# Patient Record
Sex: Female | Born: 1960 | Race: White | Hispanic: No | Marital: Married | Smoking: Former smoker
Health system: Southern US, Community
[De-identification: ages and names within clinical notes are randomized; demographics above are authoritative.]

## PROBLEM LIST (undated history)

## (undated) DIAGNOSIS — F419 Anxiety disorder, unspecified: Secondary | ICD-10-CM

## (undated) DIAGNOSIS — E119 Type 2 diabetes mellitus without complications: Secondary | ICD-10-CM

## (undated) DIAGNOSIS — Z87442 Personal history of urinary calculi: Secondary | ICD-10-CM

## (undated) DIAGNOSIS — I10 Essential (primary) hypertension: Secondary | ICD-10-CM

## (undated) DIAGNOSIS — R7303 Prediabetes: Secondary | ICD-10-CM

## (undated) DIAGNOSIS — E785 Hyperlipidemia, unspecified: Secondary | ICD-10-CM

## (undated) DIAGNOSIS — T7840XA Allergy, unspecified, initial encounter: Secondary | ICD-10-CM

## (undated) HISTORY — DX: Anxiety disorder, unspecified: F41.9

## (undated) HISTORY — DX: Allergy, unspecified, initial encounter: T78.40XA

## (undated) HISTORY — DX: Personal history of urinary calculi: Z87.442

## (undated) HISTORY — DX: Type 2 diabetes mellitus without complications: E11.9

## (undated) HISTORY — DX: Essential (primary) hypertension: I10

## (undated) HISTORY — DX: Prediabetes: R73.03

## (undated) HISTORY — DX: Hyperlipidemia, unspecified: E78.5

## (undated) HISTORY — PX: TONSILLECTOMY AND ADENOIDECTOMY: SUR1326

---

## 1998-05-27 ENCOUNTER — Encounter: Payer: Self-pay | Admitting: Obstetrics and Gynecology

## 1998-05-27 ENCOUNTER — Ambulatory Visit (HOSPITAL_COMMUNITY): Admission: RE | Admit: 1998-05-27 | Discharge: 1998-05-27 | Payer: Self-pay | Admitting: Surgery

## 1998-06-14 ENCOUNTER — Other Ambulatory Visit: Admission: RE | Admit: 1998-06-14 | Discharge: 1998-06-14 | Payer: Self-pay | Admitting: Obstetrics and Gynecology

## 1999-09-26 ENCOUNTER — Other Ambulatory Visit: Admission: RE | Admit: 1999-09-26 | Discharge: 1999-09-26 | Payer: Self-pay | Admitting: *Deleted

## 2000-10-07 ENCOUNTER — Other Ambulatory Visit: Admission: RE | Admit: 2000-10-07 | Discharge: 2000-10-07 | Payer: Self-pay | Admitting: Obstetrics and Gynecology

## 2001-10-20 ENCOUNTER — Other Ambulatory Visit: Admission: RE | Admit: 2001-10-20 | Discharge: 2001-10-20 | Payer: Self-pay | Admitting: Obstetrics and Gynecology

## 2002-07-15 ENCOUNTER — Ambulatory Visit (HOSPITAL_COMMUNITY): Admission: RE | Admit: 2002-07-15 | Discharge: 2002-07-15 | Payer: Self-pay | Admitting: Obstetrics and Gynecology

## 2002-07-15 ENCOUNTER — Encounter: Payer: Self-pay | Admitting: Obstetrics and Gynecology

## 2003-07-20 ENCOUNTER — Ambulatory Visit (HOSPITAL_COMMUNITY): Admission: RE | Admit: 2003-07-20 | Discharge: 2003-07-20 | Payer: Self-pay | Admitting: Obstetrics and Gynecology

## 2004-08-02 ENCOUNTER — Ambulatory Visit (HOSPITAL_COMMUNITY): Admission: RE | Admit: 2004-08-02 | Discharge: 2004-08-02 | Payer: Self-pay | Admitting: Obstetrics and Gynecology

## 2004-08-16 ENCOUNTER — Encounter: Admission: RE | Admit: 2004-08-16 | Discharge: 2004-08-16 | Payer: Self-pay | Admitting: Obstetrics and Gynecology

## 2005-08-20 ENCOUNTER — Ambulatory Visit (HOSPITAL_COMMUNITY): Admission: RE | Admit: 2005-08-20 | Discharge: 2005-08-20 | Payer: Self-pay | Admitting: Obstetrics and Gynecology

## 2006-09-25 ENCOUNTER — Encounter: Admission: RE | Admit: 2006-09-25 | Discharge: 2006-09-25 | Payer: Self-pay | Admitting: Family Medicine

## 2007-08-11 ENCOUNTER — Ambulatory Visit (HOSPITAL_COMMUNITY): Admission: RE | Admit: 2007-08-11 | Discharge: 2007-08-11 | Payer: Self-pay | Admitting: Internal Medicine

## 2007-08-11 ENCOUNTER — Other Ambulatory Visit: Admission: RE | Admit: 2007-08-11 | Discharge: 2007-08-11 | Payer: Self-pay | Admitting: Internal Medicine

## 2007-09-15 ENCOUNTER — Ambulatory Visit (HOSPITAL_COMMUNITY): Admission: RE | Admit: 2007-09-15 | Discharge: 2007-09-15 | Payer: Self-pay | Admitting: Urology

## 2008-02-23 ENCOUNTER — Ambulatory Visit (HOSPITAL_COMMUNITY): Admission: RE | Admit: 2008-02-23 | Discharge: 2008-02-23 | Payer: Self-pay | Admitting: Internal Medicine

## 2008-10-11 ENCOUNTER — Ambulatory Visit (HOSPITAL_COMMUNITY): Admission: RE | Admit: 2008-10-11 | Discharge: 2008-10-11 | Payer: Self-pay | Admitting: Internal Medicine

## 2008-10-12 ENCOUNTER — Other Ambulatory Visit: Admission: RE | Admit: 2008-10-12 | Discharge: 2008-10-12 | Payer: Self-pay | Admitting: Internal Medicine

## 2009-06-26 IMAGING — CT CT UROGRAM
2 series · 16 of 42 positions shown, 19 images · non-contrast
Comparison: NONE

CLINICAL DATA: Left flank pain. 

CT UROGRAM
TECHNIQUE: Multiple contiguous axial images are performed from 
the lung bases through the pubic symphysis without oral or IV 
contrast.

[Series 2: wo · axial · 0.68mm/px · z∈[+664,+1033]mm · 13 of 137 slices shown, 16 images]
[im 9/137  soft-tissue]
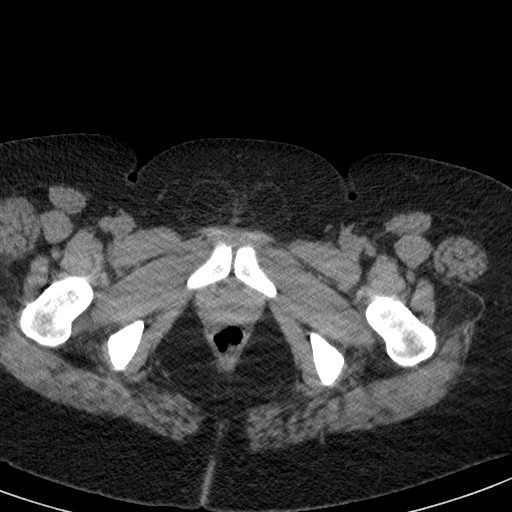
[im 9/137  bone]
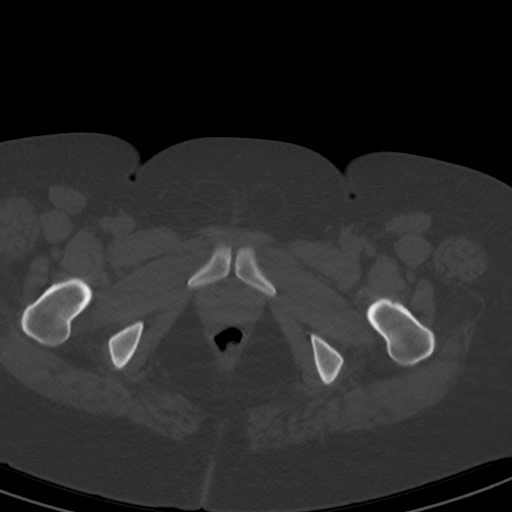
[im 22/137  soft-tissue]
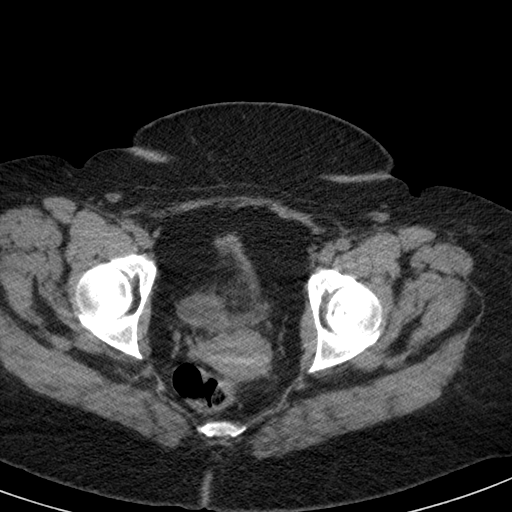
[im 36/137  soft-tissue]
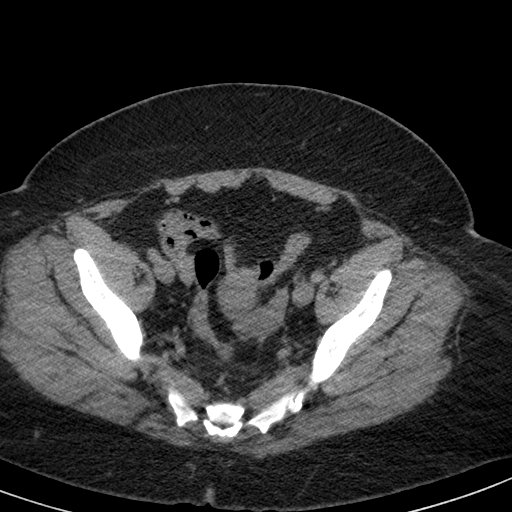
[im 49/137  soft-tissue]
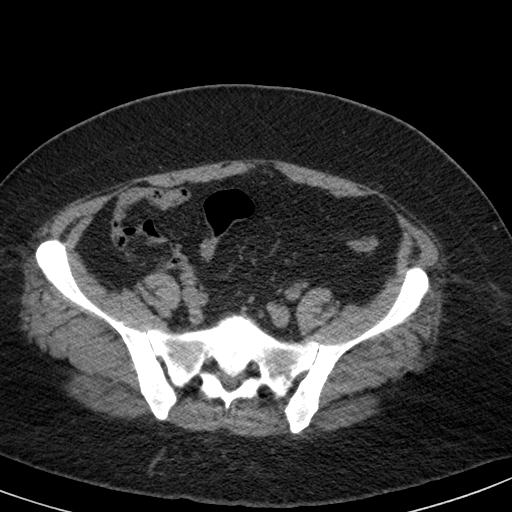
[im 62/137  soft-tissue]
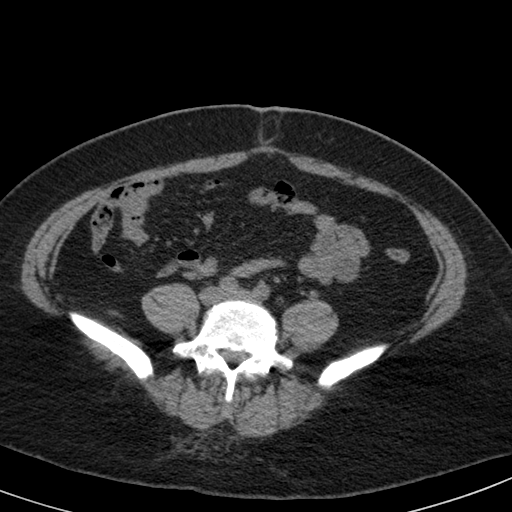
[im 75/137  soft-tissue]
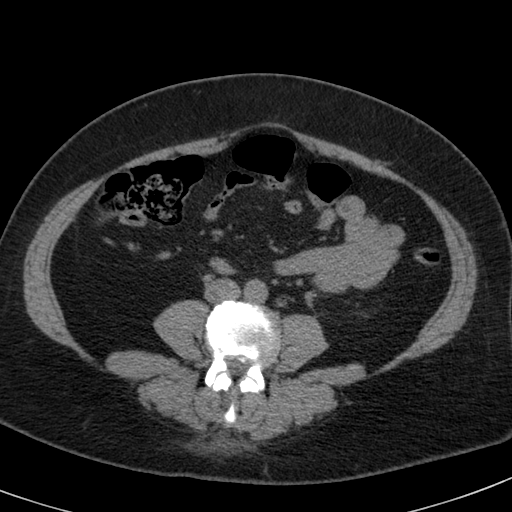
[im 88/137  soft-tissue]
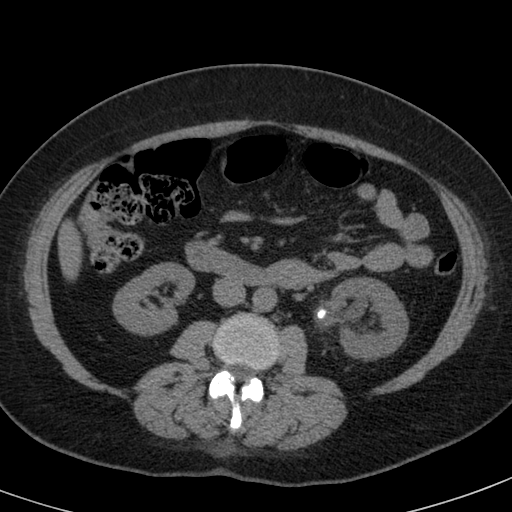
[im 101/137  soft-tissue]
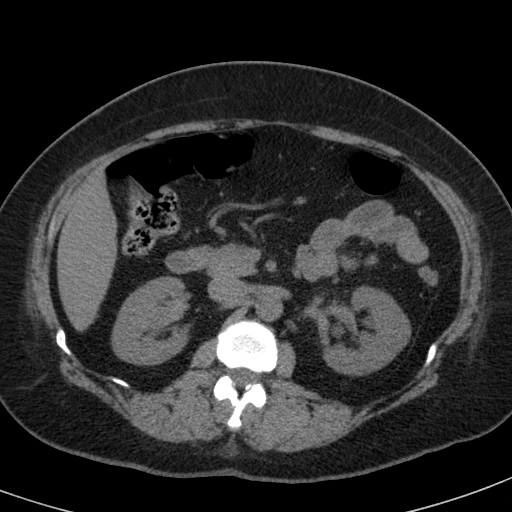
[im 115/137  soft-tissue]
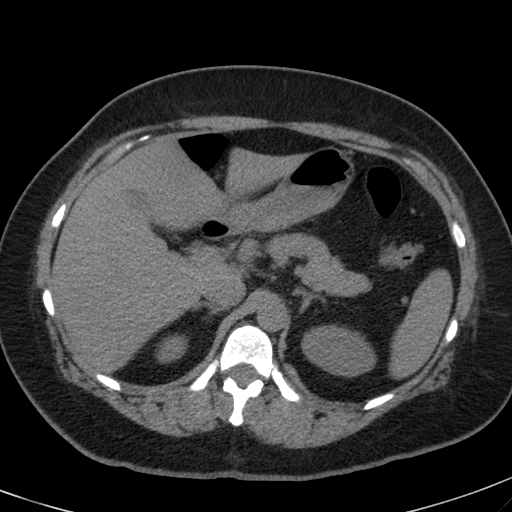
[im 115/137  bone]
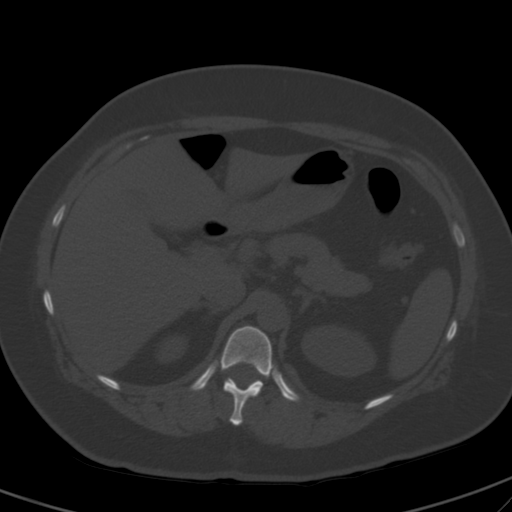
[im 119/137  lung]
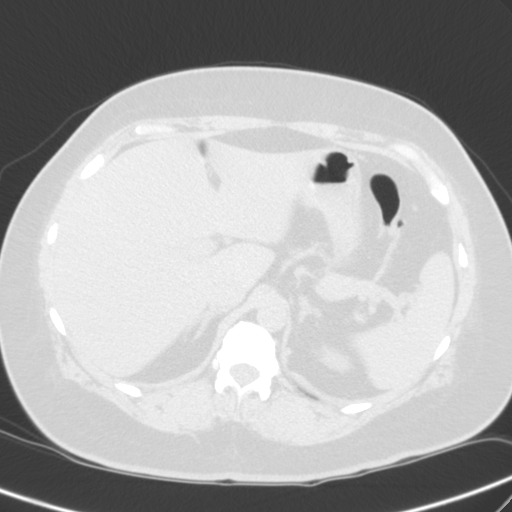
[im 123/137  lung]
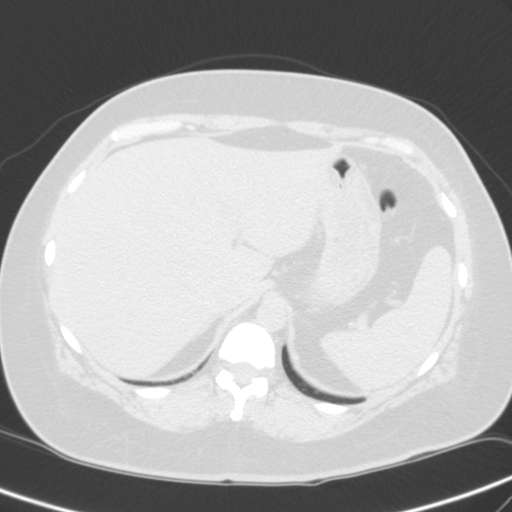
[im 128/137  soft-tissue]
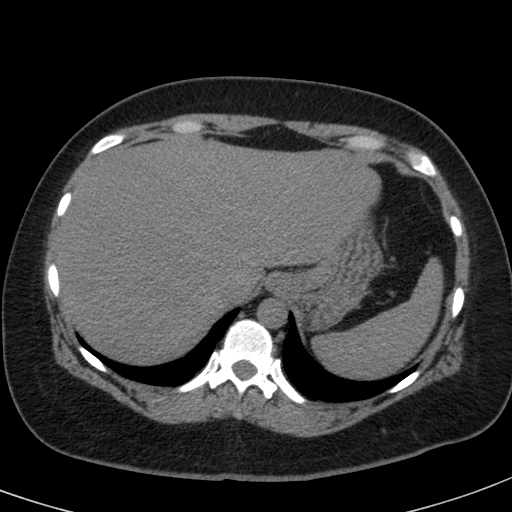
[im 128/137  lung]
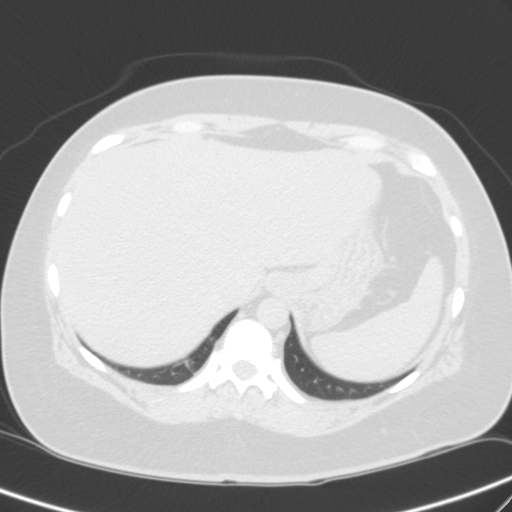
[im 132/137  lung]
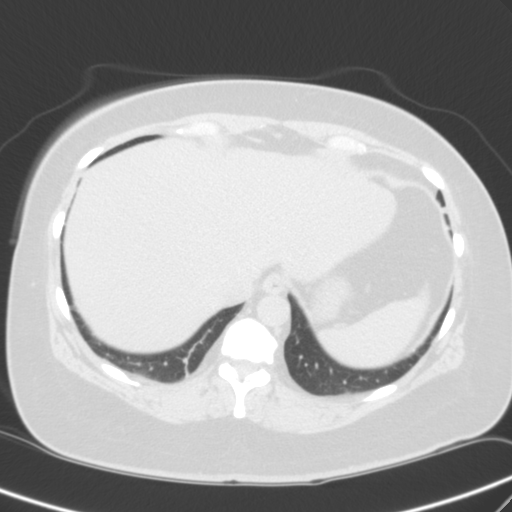

[coronals · coronal · 0.79mm/px · 3 of 76 slices shown]
[im 26/76  soft-tissue]
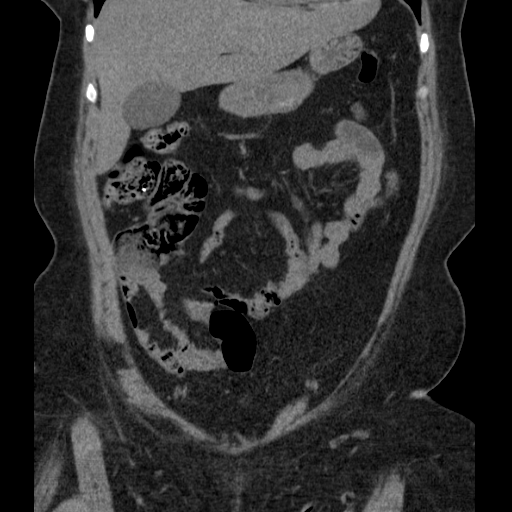
[im 34/76  soft-tissue]
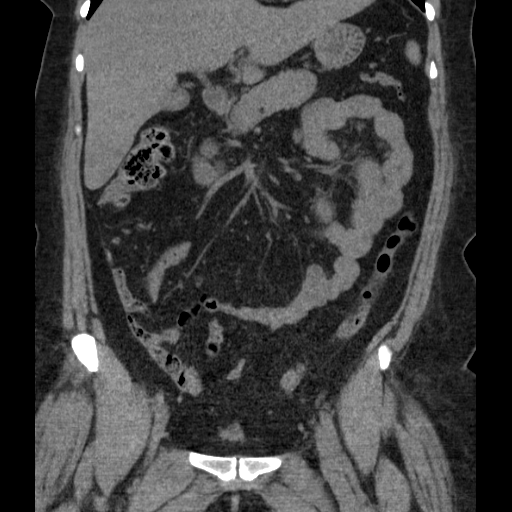
[im 42/76  soft-tissue]
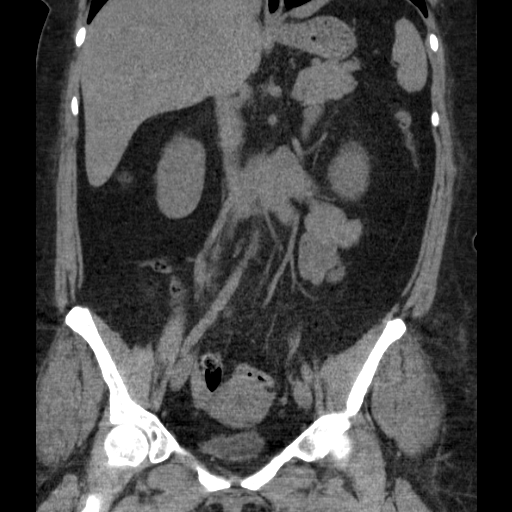

[16 of 42 positions shown; findings below may reference images not displayed]

FINDINGS: Moderate left-sided pelvicaliectasis is noted secondary 
to a partially obstructing 1.3 cm craniocaudal by 0.6 cm 
transverse by 0.7 cm AP left UPJ/proximal uretal calculus.  Mild 
perinephric soft tissue stranding is noted on the left, as well as 
mild proximal periureteral soft tissue stranding.  No additional 
calculi are identified.  The urinary bladder is unremarkable.  
Limited noncontrasted imaging of the remainder of the abdomen and 
pelvis demonstrates minimal subsegmental atelectasis and/or 
scarring at the lung bases.  No focal abnormalities of the liver, 
spleen, pancreas, gallbladder, or adrenals.  Bowel is normal in 
course and caliber.  The appendix is normal.  Small fat-containing 
paraumbilical hernia.  The uterus is mildly prominent.  Trace free 
fluid is present in the left pelvis.  No acute osseous 
abnormalities.  Moderate right-sided fecal stasis.
IMPRESSION: Partially obstructing 8.1x3.7 cm left UPJ/proximal

## 2009-07-03 IMAGING — CR DG ABDOMEN 1V
1 series · 1 of 1 positions shown · non-contrast
Comparison: None

CLINICAL DATA: Left ureteral stone for lithotripsy

ABDOMEN - 1 VIEW

[view not recorded]
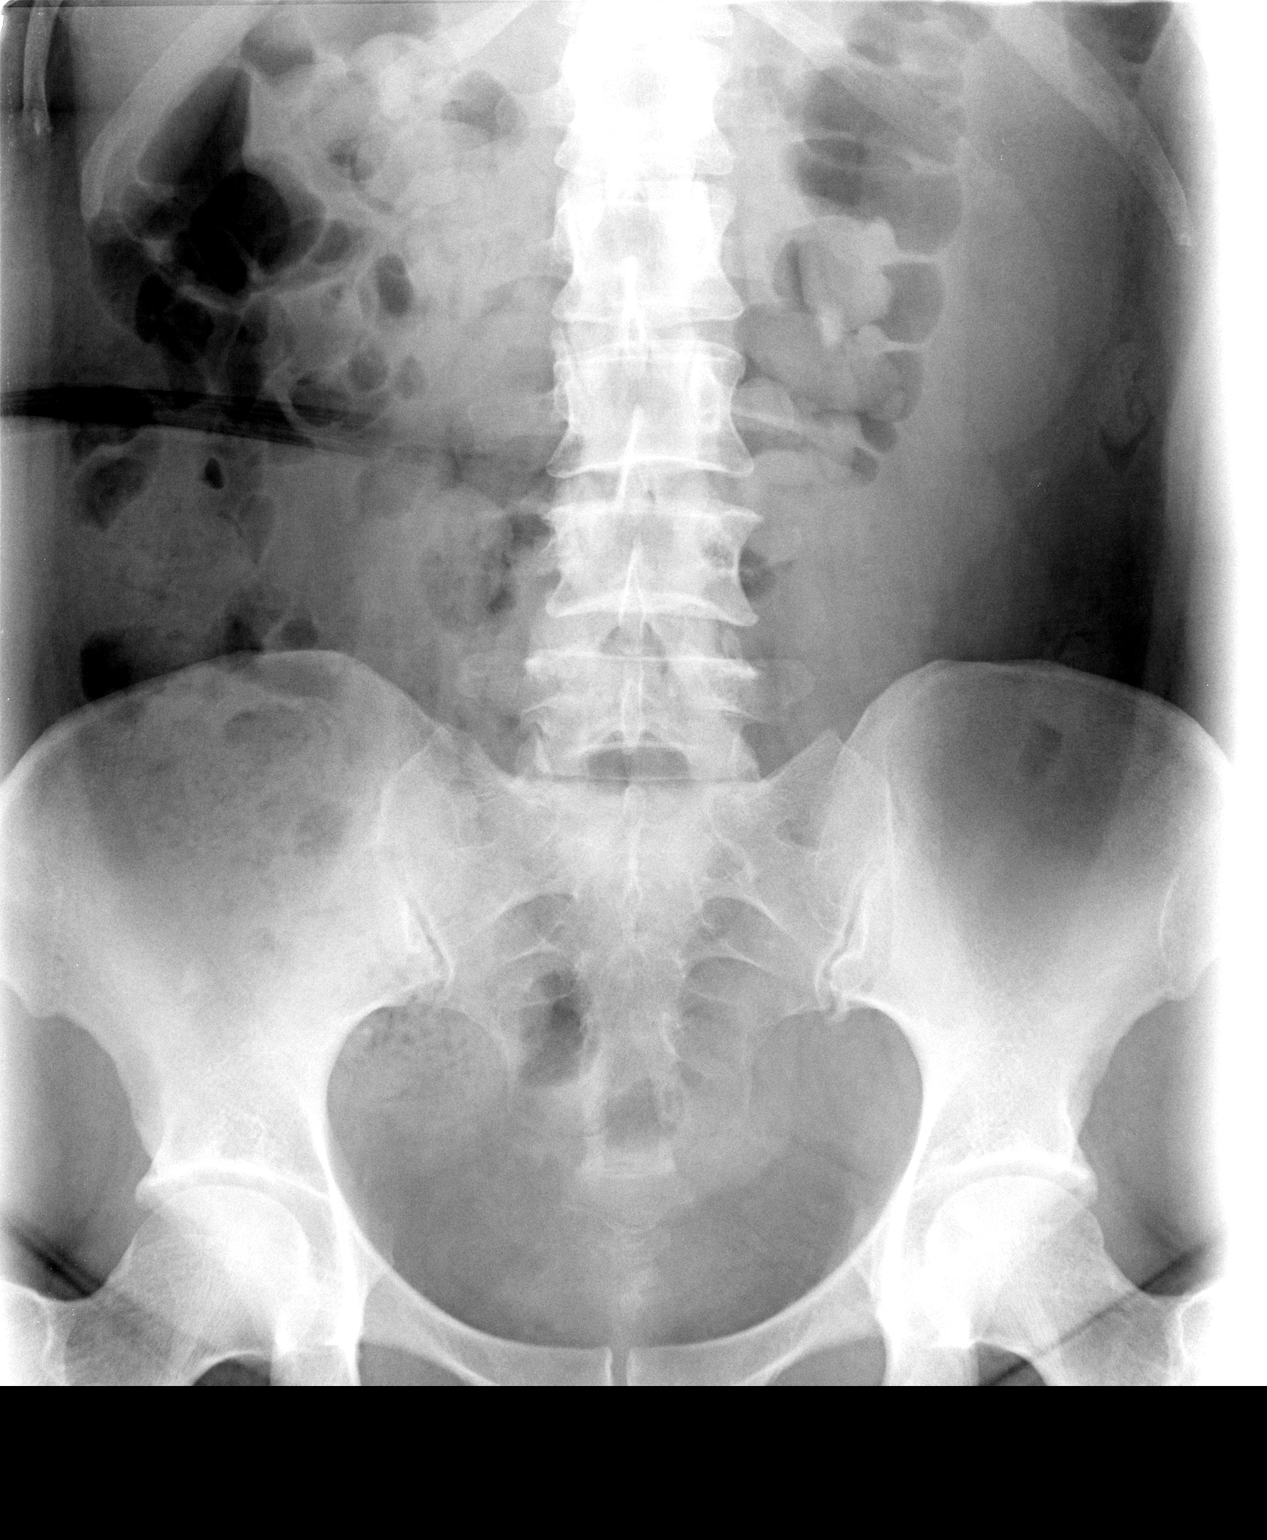

[1 of 1 positions shown; findings below may reference images not displayed]

FINDINGS: There is an 11 mm stone projected in the region of the
left UPJ or proximal ureter.  No other urinary tract calcifications
or appreciable.  Bowel gas pattern is unremarkable.  Ordinary
degenerative changes effect the spine.
IMPRESSION: 11 mm stone at the left UPJ or proximal ureter.

## 2009-08-02 ENCOUNTER — Encounter: Admission: RE | Admit: 2009-08-02 | Discharge: 2009-08-02 | Payer: Self-pay | Admitting: Internal Medicine

## 2010-04-23 ENCOUNTER — Encounter: Payer: Self-pay | Admitting: Obstetrics and Gynecology

## 2010-07-30 IMAGING — CR DG CHEST 2V
2 series · 2 of 2 positions shown · non-contrast
Comparison: 08/11/2007

CLINICAL DATA: Annual complete physical exam, history of positive
PPD, smoker

CHEST - 2 VIEW

[view not recorded (1 of 2)]
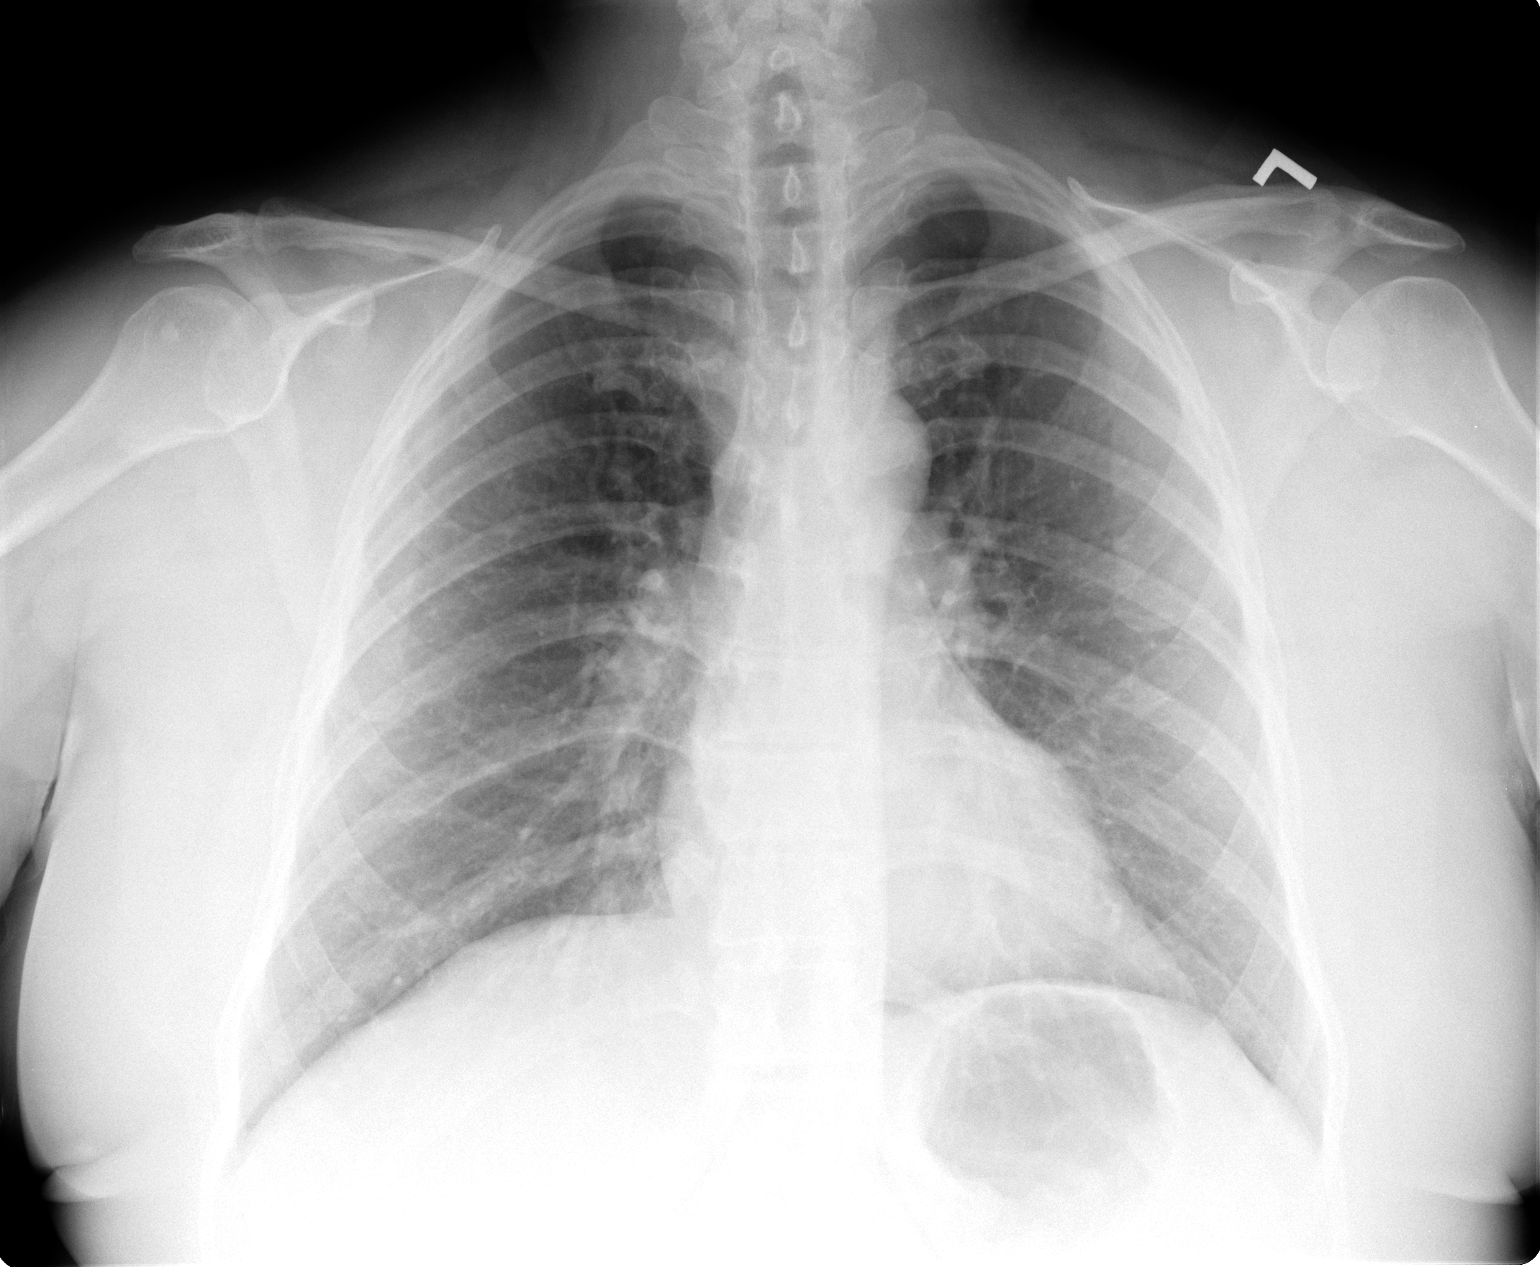

[view not recorded (2 of 2)]
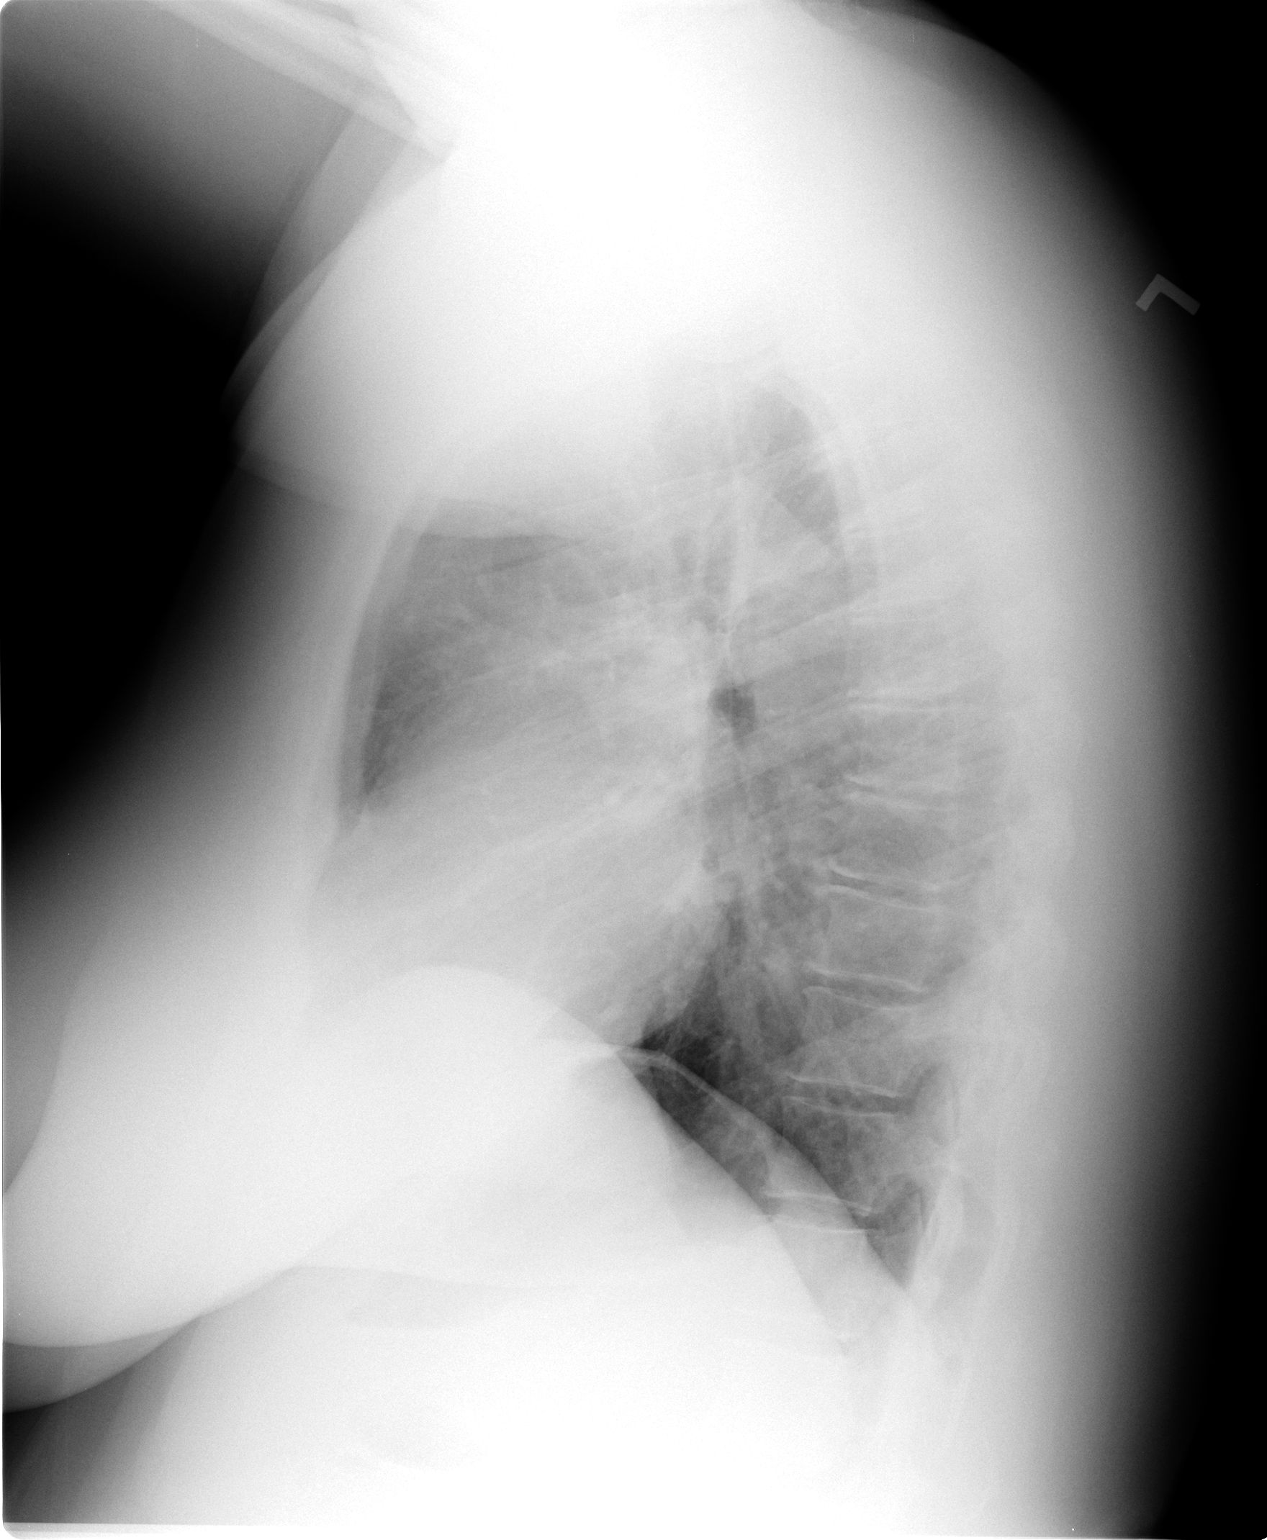

[2 of 2 positions shown; findings below may reference images not displayed]

FINDINGS: Cardiomediastinal silhouette is within normal limits. The
lungs are clear. No pleural effusion.  No pneumothorax.  No acute
osseous abnormality. Right proximal humeral bone island again
incidentally noted.
IMPRESSION: No acute cardiopulmonary process.

## 2010-12-28 LAB — PREGNANCY, URINE: Preg Test, Ur: NEGATIVE

## 2011-01-01 ENCOUNTER — Other Ambulatory Visit (HOSPITAL_COMMUNITY): Payer: Self-pay | Admitting: Internal Medicine

## 2011-01-01 DIAGNOSIS — Z1231 Encounter for screening mammogram for malignant neoplasm of breast: Secondary | ICD-10-CM

## 2011-01-08 ENCOUNTER — Ambulatory Visit (HOSPITAL_COMMUNITY)
Admission: RE | Admit: 2011-01-08 | Discharge: 2011-01-08 | Disposition: A | Discharge status: Home / Self Care | Payer: 59 | Source: Ambulatory Visit | Attending: Internal Medicine | Admitting: Internal Medicine

## 2011-01-08 DIAGNOSIS — Z1231 Encounter for screening mammogram for malignant neoplasm of breast: Secondary | ICD-10-CM | POA: Insufficient documentation

## 2011-06-05 ENCOUNTER — Other Ambulatory Visit (HOSPITAL_COMMUNITY)
Admission: RE | Admit: 2011-06-05 | Discharge: 2011-06-05 | Disposition: A | Discharge status: Home / Self Care | Payer: 59 | Source: Ambulatory Visit | Attending: Internal Medicine | Admitting: Internal Medicine

## 2011-06-05 DIAGNOSIS — Z1159 Encounter for screening for other viral diseases: Secondary | ICD-10-CM | POA: Insufficient documentation

## 2011-06-05 DIAGNOSIS — Z01419 Encounter for gynecological examination (general) (routine) without abnormal findings: Secondary | ICD-10-CM | POA: Insufficient documentation

## 2011-12-13 ENCOUNTER — Other Ambulatory Visit (HOSPITAL_COMMUNITY): Payer: Self-pay | Admitting: Internal Medicine

## 2011-12-13 DIAGNOSIS — Z1231 Encounter for screening mammogram for malignant neoplasm of breast: Secondary | ICD-10-CM

## 2012-01-15 ENCOUNTER — Ambulatory Visit (HOSPITAL_COMMUNITY)
Admission: RE | Admit: 2012-01-15 | Discharge: 2012-01-15 | Disposition: A | Discharge status: Home / Self Care | Payer: 59 | Source: Ambulatory Visit | Attending: Internal Medicine | Admitting: Internal Medicine

## 2012-01-15 DIAGNOSIS — Z1231 Encounter for screening mammogram for malignant neoplasm of breast: Secondary | ICD-10-CM | POA: Insufficient documentation

## 2012-12-16 ENCOUNTER — Other Ambulatory Visit (HOSPITAL_COMMUNITY): Payer: Self-pay | Admitting: Internal Medicine

## 2012-12-16 DIAGNOSIS — Z1231 Encounter for screening mammogram for malignant neoplasm of breast: Secondary | ICD-10-CM

## 2013-01-15 ENCOUNTER — Ambulatory Visit (HOSPITAL_COMMUNITY): Payer: 59

## 2013-01-23 ENCOUNTER — Ambulatory Visit (HOSPITAL_COMMUNITY)
Admission: RE | Admit: 2013-01-23 | Discharge: 2013-01-23 | Disposition: A | Discharge status: Home / Self Care | Payer: 59 | Source: Ambulatory Visit | Attending: Internal Medicine | Admitting: Internal Medicine

## 2013-01-23 DIAGNOSIS — Z1231 Encounter for screening mammogram for malignant neoplasm of breast: Secondary | ICD-10-CM | POA: Insufficient documentation

## 2013-04-22 ENCOUNTER — Other Ambulatory Visit: Payer: Self-pay | Admitting: Internal Medicine

## 2013-05-27 ENCOUNTER — Other Ambulatory Visit: Payer: Self-pay | Admitting: Internal Medicine

## 2013-06-06 DIAGNOSIS — Z87442 Personal history of urinary calculi: Secondary | ICD-10-CM | POA: Insufficient documentation

## 2013-06-06 DIAGNOSIS — F419 Anxiety disorder, unspecified: Secondary | ICD-10-CM | POA: Insufficient documentation

## 2013-06-06 DIAGNOSIS — I10 Essential (primary) hypertension: Secondary | ICD-10-CM | POA: Insufficient documentation

## 2013-06-06 DIAGNOSIS — Z9109 Other allergy status, other than to drugs and biological substances: Secondary | ICD-10-CM | POA: Insufficient documentation

## 2013-06-06 DIAGNOSIS — E119 Type 2 diabetes mellitus without complications: Secondary | ICD-10-CM | POA: Insufficient documentation

## 2013-06-06 DIAGNOSIS — E785 Hyperlipidemia, unspecified: Secondary | ICD-10-CM

## 2013-06-06 DIAGNOSIS — E1169 Type 2 diabetes mellitus with other specified complication: Secondary | ICD-10-CM | POA: Insufficient documentation

## 2013-06-09 ENCOUNTER — Ambulatory Visit (INDEPENDENT_AMBULATORY_CARE_PROVIDER_SITE_OTHER): Payer: 59 | Admitting: Physician Assistant

## 2013-06-09 ENCOUNTER — Encounter: Payer: Self-pay | Admitting: Physician Assistant

## 2013-06-09 VITALS — BP 138/88 | HR 80 | Temp 97.9°F | Resp 16 | Ht 64.5 in | Wt 255.0 lb

## 2013-06-09 DIAGNOSIS — Z0000 Encounter for general adult medical examination without abnormal findings: Secondary | ICD-10-CM

## 2013-06-09 DIAGNOSIS — Z1331 Encounter for screening for depression: Secondary | ICD-10-CM

## 2013-06-09 DIAGNOSIS — Z23 Encounter for immunization: Secondary | ICD-10-CM

## 2013-06-09 DIAGNOSIS — E119 Type 2 diabetes mellitus without complications: Secondary | ICD-10-CM

## 2013-06-09 LAB — CBC WITH DIFFERENTIAL/PLATELET
BASOS ABS: 0 10*3/uL (ref 0.0–0.1)
Basophils Relative: 0 % (ref 0–1)
EOS ABS: 0.1 10*3/uL (ref 0.0–0.7)
EOS PCT: 1 % (ref 0–5)
HCT: 45.5 % (ref 36.0–46.0)
HEMOGLOBIN: 15.4 g/dL — AB (ref 12.0–15.0)
LYMPHS PCT: 23 % (ref 12–46)
Lymphs Abs: 2.4 10*3/uL (ref 0.7–4.0)
MCH: 29.9 pg (ref 26.0–34.0)
MCHC: 33.8 g/dL (ref 30.0–36.0)
MCV: 88.3 fL (ref 78.0–100.0)
MONO ABS: 0.7 10*3/uL (ref 0.1–1.0)
Monocytes Relative: 7 % (ref 3–12)
NEUTROS PCT: 69 % (ref 43–77)
Neutro Abs: 7.2 10*3/uL (ref 1.7–7.7)
PLATELETS: 324 10*3/uL (ref 150–400)
RBC: 5.15 MIL/uL — ABNORMAL HIGH (ref 3.87–5.11)
RDW: 13.4 % (ref 11.5–15.5)
WBC: 10.5 10*3/uL (ref 4.0–10.5)

## 2013-06-09 LAB — HEMOGLOBIN A1C
HEMOGLOBIN A1C: 7.8 % — AB (ref <5.7)
MEAN PLASMA GLUCOSE: 177 mg/dL — AB (ref <117)

## 2013-06-09 NOTE — Patient Instructions (Signed)
Preventative Care for Adults - Female      MAINTAIN REGULAR HEALTH EXAMS:  A routine yearly physical is a good way to check in with your primary care provider about your health and preventive screening. It is also an opportunity to share updates about your health and any concerns you have, and receive a thorough all-over exam.   Most health insurance companies pay for at least some preventative services.  Check with your health plan for specific coverages.  WHAT PREVENTATIVE SERVICES DO WOMEN NEED?  Adult women should have their weight and blood pressure checked regularly.   Women age 35 and older should have their cholesterol levels checked regularly.  Women should be screened for cervical cancer with a Pap smear and pelvic exam beginning at either age 21, or 3 years after they become sexually activity.    Breast cancer screening generally begins at age 40 with a mammogram and breast exam by your primary care provider.    Beginning at age 50 and continuing to age 75, women should be screened for colorectal cancer.  Certain people may need continued testing until age 85.  Updating vaccinations is part of preventative care.  Vaccinations help protect against diseases such as the flu.  Osteoporosis is a disease in which the bones lose minerals and strength as we age. Women ages 65 and over should discuss this with their caregivers, as should women after menopause who have other risk factors.  Lab tests are generally done as part of preventative care to screen for anemia and blood disorders, to screen for problems with the kidneys and liver, to screen for bladder problems, to check blood sugar, and to check your cholesterol level.  Preventative services generally include counseling about diet, exercise, avoiding tobacco, drugs, excessive alcohol consumption, and sexually transmitted infections.    GENERAL RECOMMENDATIONS FOR GOOD HEALTH:  Healthy diet:  Eat a variety of foods, including  fruit, vegetables, animal or vegetable protein, such as meat, fish, chicken, and eggs, or beans, lentils, tofu, and grains, such as rice.  Drink plenty of water daily.  Decrease saturated fat in the diet, avoid lots of red meat, processed foods, sweets, fast foods, and fried foods.  Exercise:  Aerobic exercise helps maintain good heart health. At least 30-40 minutes of moderate-intensity exercise is recommended. For example, a brisk walk that increases your heart rate and breathing. This should be done on most days of the week.   Find a type of exercise or a variety of exercises that you enjoy so that it becomes a part of your daily life.  Examples are running, walking, swimming, water aerobics, and biking.  For motivation and support, explore group exercise such as aerobic class, spin class, Zumba, Yoga,or  martial arts, etc.    Set exercise goals for yourself, such as a certain weight goal, walk or run in a race such as a 5k walk/run.  Speak to your primary care provider about exercise goals.  Disease prevention:  If you smoke or chew tobacco, find out from your caregiver how to quit. It can literally save your life, no matter how long you have been a tobacco user. If you do not use tobacco, never begin.   Maintain a healthy diet and normal weight. Increased weight leads to problems with blood pressure and diabetes.   The Body Mass Index or BMI is a way of measuring how much of your body is fat. Having a BMI above 27 increases the risk of heart disease,   diabetes, hypertension, stroke and other problems related to obesity. Your caregiver can help determine your BMI and based on it develop an exercise and dietary program to help you achieve or maintain this important measurement at a healthful level.  High blood pressure causes heart and blood vessel problems.  Persistent high blood pressure should be treated with medicine if weight loss and exercise do not work.   Fat and cholesterol leaves  deposits in your arteries that can block them. This causes heart disease and vessel disease elsewhere in your body.  If your cholesterol is found to be high, or if you have heart disease or certain other medical conditions, then you may need to have your cholesterol monitored frequently and be treated with medication.   Ask if you should have a cardiac stress test if your history suggests this. A stress test is a test done on a treadmill that looks for heart disease. This test can find disease prior to there being a problem.  Menopause can be associated with physical symptoms and risks. Hormone replacement therapy is available to decrease these. You should talk to your caregiver about whether starting or continuing to take hormones is right for you.   Osteoporosis is a disease in which the bones lose minerals and strength as we age. This can result in serious bone fractures. Risk of osteoporosis can be identified using a bone density scan. Women ages 4 and over should discuss this with their caregivers, as should women after menopause who have other risk factors. Ask your caregiver whether you should be taking a calcium supplement and Vitamin D, to reduce the rate of osteoporosis.   Avoid drinking alcohol in excess (more than two drinks per day).  Avoid use of street drugs. Do not share needles with anyone. Ask for professional help if you need assistance or instructions on stopping the use of alcohol, cigarettes, and/or drugs.  Brush your teeth twice a day with fluoride toothpaste, and floss once a day. Good oral hygiene prevents tooth decay and gum disease. The problems can be painful, unattractive, and can cause other health problems. Visit your dentist for a routine oral and dental check up and preventive care every 6-12 months.   Look at your skin regularly.  Use a mirror to look at your back. Notify your caregivers of changes in moles, especially if there are changes in shapes, colors, a size  larger than a pencil eraser, an irregular border, or development of new moles.  Safety:  Use seatbelts 100% of the time, whether driving or as a passenger.  Use safety devices such as hearing protection if you work in environments with loud noise or significant background noise.  Use safety glasses when doing any work that could send debris in to the eyes.  Use a helmet if you ride a bike or motorcycle.  Use appropriate safety gear for contact sports.  Talk to your caregiver about gun safety.  Use sunscreen with a SPF (or skin protection factor) of 15 or greater.  Lighter skinned people are at a greater risk of skin cancer. Don't forget to also wear sunglasses in order to protect your eyes from too much damaging sunlight. Damaging sunlight can accelerate cataract formation.   Practice safe sex. Use condoms. Condoms are used for birth control and to help reduce the spread of sexually transmitted infections (or STIs).  Some of the STIs are gonorrhea (the clap), chlamydia, syphilis, trichomonas, herpes, HPV (human papilloma virus) and HIV (human immunodeficiency virus)  which causes AIDS. The herpes, HIV and HPV are viral illnesses that have no cure. These can result in disability, cancer and death.   Keep carbon monoxide and smoke detectors in your home functioning at all times. Change the batteries every 6 months or use a model that plugs into the wall.   Vaccinations:  Stay up to date with your tetanus shots and other required immunizations. You should have a booster for tetanus every 10 years. Be sure to get your flu shot every year, since 5%-20% of the [place] population comes down with the flu. The flu vaccine changes each year, so being vaccinated once is not enough. Get your shot in the fall, before the flu season peaks.   Other vaccines to consider:  Human Papilloma Virus or HPV causes cancer of the cervix, and other infections that can be transmitted from person to person. There is a vaccine for  HPV, and females should get immunized between the ages of 11 and 26. It requires a series of 3 shots.   Pneumococcal vaccine to protect against certain types of pneumonia.  This is normally recommended for adults age 65 or older.  However, adults younger than 51 years old with certain underlying conditions such as diabetes, heart or lung disease should also receive the vaccine.  Shingles vaccine to protect against Varicella Zoster if you are older than age 60, or younger than 51 years old with certain underlying illness.  Hepatitis A vaccine to protect against a form of infection of the liver by a virus acquired from food.  Hepatitis B vaccine to protect against a form of infection of the liver by a virus acquired from blood or body fluids, particularly if you work in health care.  If you plan to travel internationally, check with your local health department for specific vaccination recommendations.  Cancer Screening:  Breast cancer screening is essential to preventive care for women. All women age 20 and older should perform a breast self-exam every month. At age 40 and older, women should have their caregiver complete a breast exam each year. Women at ages 40 and older should have a mammogram (x-ray film) of the breasts. Your caregiver can discuss how often you need mammograms.    Cervical cancer screening includes taking a Pap smear (sample of cells examined under a microscope) from the cervix (end of the uterus). It also includes testing for HPV (Human Papilloma Virus, which can cause cervical cancer). Screening and a pelvic exam should begin at age 21, or 3 years after a woman becomes sexually active. Screening should occur every year, with a Pap smear but no HPV testing, up to age 30. After age 30, you should have a Pap smear every 3 years with HPV testing, if no HPV was found previously.   Most routine colon cancer screening begins at the age of 50. On a yearly basis, doctors may provide  special easy to use take-home tests to check for hidden blood in the stool. Sigmoidoscopy or colonoscopy can detect the earliest forms of colon cancer and is life saving. These tests use a small camera at the end of a tube to directly examine the colon. Speak to your caregiver about this at age 50, when routine screening begins (and is repeated every 5 years unless early forms of pre-cancerous polyps or small growths are found).      Bad carbs also include fruit juice, alcohol, and sweet tea. These are empty calories that do not signal to your   brain that you are full.   Please remember the good carbs are still carbs which convert into sugar. So please measure them out no more than 1/2-1 cup of rice, oatmeal, pasta, and beans.  Veggies are however free foods! Pile them on.   I like lean protein at every meal such as chicken, [place], pork chops, cottage cheese, etc. Just do not fry these meats and please center your meal around vegetable, the meats should be a side dish.   No all fruit is created equal. Please see the list below, the fruit at the bottom is higher in sugars than the fruit at the top   We want weight loss that will last so you should lose 1-2 pounds a week.  THAT IS IT! Please pick THREE things a month to change. Once it is a habit check off the item. Then pick another three items off the list to become habits.  If you are already doing a habit on the list GREAT!  Cross that item off! o Don't drink your calories. Ie, alcohol, soda, fruit juice, and sweet tea.  o Drink more water. Drink a glass when you feel hungry or before each meal.  o Eat breakfast - Complex carb and protein (likeDannon light and fit yogurt, oatmeal, fruit, eggs, [place] bacon). o Measure your cereal.  Eat no more than one cup a day. (ie [place]) o Eat an apple a day. o Add a vegetable a day. o Try a new vegetable a month. o Use Pam! Stop using oil or butter to cook. o Don't finish your plate or use smaller  plates. o Share your dessert. o Eat sugar free Jello for dessert or frozen grapes. o Don't eat 2-3 hours before bed. o Switch to whole wheat bread, pasta, and brown rice. o Make healthier choices when you eat out. No fries! o Pick baked chicken, NOT fried. o Don't forget to SLOW DOWN when you eat. It is not going anywhere.  o Take the stairs. o Park far away in the parking lot o [employer] (or weights) for 10 minutes while watching TV. o Walk at work for 10 minutes during break. o Walk outside 1 time a week with your friend, kids, dog, or significant other. o Start a walking group at [place] the mall as much as you can tolerate.  o Keep a food diary. o Weigh yourself daily. o Walk for 15 minutes 3 days per week. o Cook at home more often and eat out less.  If life happens and you go back to old habits, it is okay.  Just start over. You can do it!   If you experience chest pain, get short of breath, or tired during the exercise, please stop immediately and inform your doctor.  Diabetes and Standards of Medical Care  Diabetes is complicated. You may find that your diabetes team includes a dietitian, nurse, diabetes educator, eye doctor, and more. To help everyone know what is going on and to help you get the care you deserve, the following schedule of care was developed to help keep you on track. Below are the tests, exams, vaccines, medicines, education, and plans you will need. HbA1c test This test shows how well you have controlled your glucose over the past 2 3 months. It is used to see if your diabetes management plan needs to be adjusted.   It is performed at least 2 times a year if you are meeting treatment goals.  It is performed 4 times a year if therapy has changed or if you are not meeting treatment goals. Blood pressure test  This test is performed at every routine medical visit. The goal is less than 140/90 mmHg for most people, but 130/80 mmHg in some cases. Ask  your health care provider about your goal. Dental exam  Follow up with the dentist regularly. Eye exam  If you are diagnosed with type 1 diabetes as a child, get an exam upon reaching the age of 19 years or older and have had diabetes for 3 5 years. Yearly eye exams are recommended after that initial eye exam.  If you are diagnosed with type 1 diabetes as an adult, get an exam within 5 years of diagnosis and then yearly.  If you are diagnosed with type 2 diabetes, get an exam as soon as possible after the diagnosis and then yearly. Foot care exam  Visual foot exams are performed at every routine medical visit. The exams check for cuts, injuries, or other problems with the feet.  A comprehensive foot exam should be done yearly. This includes visual inspection as well as assessing foot pulses and testing for loss of sensation.  Check your feet nightly for cuts, injuries, or other problems with your feet. Tell your health care provider if anything is not healing. Kidney function test (urine microalbumin)  This test is performed once a year.  Type 1 diabetes: The first test is performed 5 years after diagnosis.  Type 2 diabetes: The first test is performed at the time of diagnosis.  A serum creatinine and estimated glomerular filtration rate (eGFR) test is done once a year to assess the level of chronic kidney disease (CKD), if present. Lipid profile (cholesterol, HDL, LDL, triglycerides)  Performed every 5 years for most people.  The goal for LDL is less than 100 mg/dL. If you are at high risk, the goal is less than 70 mg/dL.  The goal for HDL is 40 mg/dL 50 mg/dL for men and 50 mg/dL 60 mg/dL for women. An HDL cholesterol of 60 mg/dL or higher gives some protection against heart disease.  The goal for triglycerides is less than 150 mg/dL. Influenza vaccine, pneumococcal vaccine, and hepatitis B vaccine  The influenza vaccine is recommended yearly.  The pneumococcal vaccine is  generally given once in a lifetime. However, there are some instances when another vaccination is recommended. Check with your health care provider.  The hepatitis B vaccine is also recommended for adults with diabetes. Diabetes self-management education  Education is recommended at diagnosis and ongoing as needed. Treatment plan  Your treatment plan is reviewed at every medical visit. Document Released: 01/14/2009 Document Revised: 11/19/2012 Document Reviewed: 08/19/2012 [facility] Patient Information 2014 [employer].

## 2013-06-09 NOTE — Progress Notes (Signed)
Complete Physical HPI 53 y.o. female  presents for a complete physical. Her blood pressure has been controlled at home, today their BP is BP: 138/88 mmHg She does workout, she does stair climber 20 min 3 times a week. She denies chest pain, shortness of breath, dizziness.  She is not on cholesterol medication and denies myalgias. Her cholesterol is not at goal. The cholesterol last visit was: LDL 130, trigs 357, HDL 39. AST 25, ALT 41. TSH 3.456.   She has been working on diet and exercise for prediabetes, and denies foot ulcerations, increased appetite, paresthesia of the feet, polydipsia, polyuria and visual disturbances. Last A1C in the office was: 7.1 (5.9). It is continues to be in the diabetic range we will discuss MF and ACE.  Patient is on Vitamin D supplement.  40 Morbid obesity- weight has increased only 1 lb from last year but 10 lbs over 2 years which the patient attributes to not smoking.  Son, 41 year old,  had heart surgery for valve problem at [facility] and he is doing well, almost back to golfing 18 holes.   Current Medications:  Current Outpatient Prescriptions on File Prior to Visit  Medication Sig Dispense Refill  . cetirizine (ZYRTEC) 10 MG tablet Take 10 mg by mouth daily.      . Cholecalciferol (VITAMIN D PO) Take 2,000 Int'l Units by mouth daily.      Marland Kitchen escitalopram (LEXAPRO) 20 MG tablet take 1/2 tablet by mouth daily  30 tablet  2  . hydrochlorothiazide (HYDRODIURIL) 25 MG tablet take 1 tablet by mouth once daily  90 tablet  1  . Multiple Vitamins-Minerals (MULTIVITAMIN PO) Take by mouth daily.       No current facility-administered medications on file prior to visit.   Health Maintenance:  Immunization History  Administered Date(s) Administered  . Tdap 08/11/2007    Tetanus: 2009 Pneumovax: N/A Flu vaccine: declines Zostavax: N/A Pap: 2013 due 2016 MGM: 01/23/2013 DEXA: N/A Colonoscopy: DUE this year EGD: N/A  Allergies:  Allergies  Allergen Reactions  .  Codeine Nausea Only  . Penicillins    Medical History:  Past Medical History  Diagnosis Date  . Hypertension   . Allergy   . Anxiety   . Hyperlipidemia   . Prediabetes   . History of nephrolithiasis    Surgical History:  Past Surgical History  Procedure Laterality Date  . Tonsillectomy and adenoidectomy    . Cesarean section  1995   Family History:  Family History  Problem Relation Age of Onset  . Hypertension Mother   . Cancer Father     bladder  . Hypertension Father    Social History:  History  Substance Use Topics  . Smoking status: Former Games developer  . Smokeless tobacco: Not on file  . Alcohol Use: No    Review of Systems: [X]  = complains of  [ ]  = denies  General: Fatigue [ ]  Fever [ ]  Chills [ ]  Weakness [ ]   Insomnia [ ]  Weight change [ ]  Night sweats [ ]   Change in appetite [ ]  Eyes: Redness [ ]  Blurred vision [ ]  Diplopia [ ]  Discharge [ ]   ENT: Congestion [ ]  Sinus Pain [ ]  Post Nasal Drip [ ]  Sore Throat [ ]  Earache [ ]  hearing loss [ ]  Tinnitus [ ]  Snoring [ ]   Cardiac: Chest pain/pressure [ ]  SOB [ ]  Orthopnea [ ]   Palpitations [ ]   Paroxysmal nocturnal dyspnea[ ]  Claudication [ ]  Edema [ ]   Pulmonary: Cough [ ]  Wheezing[ ]   SOB [ ]   Pleurisy [ ]   GI: Nausea [ ]  Vomiting[ ]  Dysphagia[ ]  Heartburn[ ]  Abdominal pain [ ]  Constipation [ ] ; Diarrhea [ ]  BRBPR [ ]  Melena[ ]  Bloating [ ]  Hemorrhoids [ ]   GU: Hematuria[ ]  Dysuria [ ]  Nocturia[ ]  Urgency [ ]   Hesitancy [ ]  Discharge [ ]  Frequency [ ]   Breast:  Breast lumps [ ]   nipple discharge [ ]    Neuro: Headaches[ ]  Vertigo[ ]  Paresthesias[ ]  Spasm [ ]  Speech changes [ ]  Incoordination [ ]   Ortho: Arthritis [ ]  Joint pain [ ]  Muscle pain [ ]  Joint swelling [ ]  Back Pain [ ]  Skin:  Rash [ ]   Pruritis [ ]  Change in skin lesion [ ]   Psych: Depression[ ]  Anxiety[ ]  Confusion [ ]  Memory loss [ ]   Heme/Lypmh: Bleeding [ ]  Bruising [ ]  Enlarged lymph nodes [ ]   Endocrine: Visual blurring [ ]  Paresthesia [ ]  Polyuria [  ] Polydypsea [ ]    Heat/cold intolerance [ ]  Hypoglycemia [ ]   Physical Exam: Estimated body mass index is 43.11 kg/(m^2) as calculated from the following:   Height as of this encounter: 5' 4.5" (1.638 m).   Weight as of this encounter: 255 lb (115.667 kg).  Filed Vitals:   06/09/13 1000  BP: 138/88  Pulse: 80  Temp: 97.9 F (36.6 C)  Resp: 16   General Appearance: Well nourished, in no apparent distress. Eyes: PERRLA, EOMs, conjunctiva no swelling or erythema, normal fundi and vessels. Sinuses: No Frontal/maxillary tenderness ENT/Mouth: Ext aud canals clear, normal light reflex with TMs without erythema, bulging.  Good dentition. No erythema, swelling, or exudate on post pharynx. Tonsils not swollen or erythematous. Hearing normal.  Neck: Supple, thyroid normal. No bruits Respiratory: Respiratory effort normal, BS equal bilaterally without rales, rhonchi, wheezing or stridor. Cardio: RRR without murmurs, rubs or gallops. Brisk peripheral pulses without edema.  Chest: symmetric, with normal excursions and percussion. Breasts: defer Abdomen: Soft, +BS, obese Non tender, no guarding, rebound, hernias, masses, or organomegaly. .  Lymphatics: Non tender without lymphadenopathy.  Genitourinary: defer Musculoskeletal: Full ROM all peripheral extremities,5/5 strength, and normal gait. Skin: Warm, dry without rashes, lesions, ecchymosis.  Neuro: Cranial nerves intact, reflexes equal bilaterally. Normal muscle tone, no cerebellar symptoms. Sensation intact.  Psych: Awake and oriented X 3, normal affect, Insight and Judgment appropriate.   EKG: WNL no changes.  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   History of nephrolithiasis- no more kidney stones, check urine  Morbid obesity with co morbidities- long discussion about weight loss, diet, and exerciseFollow up in 1 month with food diary Metabolic syndrome- weight loss advised Colonoscopy this year  Discussed med's effects and SE's. Screening labs and tests as requested with regular follow-up as recommended.   Kaitlin Garcia, Kaitlin Garcia 10:11 AM

## 2013-06-10 LAB — URINALYSIS, ROUTINE W REFLEX MICROSCOPIC
BILIRUBIN URINE: NEGATIVE
Glucose, UA: NEGATIVE mg/dL
Hgb urine dipstick: NEGATIVE
Leukocytes, UA: NEGATIVE
NITRITE: NEGATIVE
PROTEIN: NEGATIVE mg/dL
SPECIFIC GRAVITY, URINE: 1.019 (ref 1.005–1.030)
UROBILINOGEN UA: 0.2 mg/dL (ref 0.0–1.0)
pH: 5 (ref 5.0–8.0)

## 2013-06-10 LAB — HEPATIC FUNCTION PANEL
ALT: 34 U/L (ref 0–35)
AST: 26 U/L (ref 0–37)
Albumin: 4.4 g/dL (ref 3.5–5.2)
Alkaline Phosphatase: 86 U/L (ref 39–117)
BILIRUBIN DIRECT: 0.1 mg/dL (ref 0.0–0.3)
BILIRUBIN TOTAL: 0.5 mg/dL (ref 0.2–1.2)
Indirect Bilirubin: 0.4 mg/dL (ref 0.2–1.2)
Total Protein: 7.1 g/dL (ref 6.0–8.3)

## 2013-06-10 LAB — BASIC METABOLIC PANEL WITH GFR
BUN: 9 mg/dL (ref 6–23)
CHLORIDE: 96 meq/L (ref 96–112)
CO2: 25 mEq/L (ref 19–32)
Calcium: 9.2 mg/dL (ref 8.4–10.5)
Creat: 0.57 mg/dL (ref 0.50–1.10)
Glucose, Bld: 203 mg/dL — ABNORMAL HIGH (ref 70–99)
Potassium: 3.6 mEq/L (ref 3.5–5.3)
SODIUM: 136 meq/L (ref 135–145)

## 2013-06-10 LAB — MICROALBUMIN / CREATININE URINE RATIO
Creatinine, Urine: 155.6 mg/dL
MICROALB UR: 3.98 mg/dL — AB (ref 0.00–1.89)
Microalb Creat Ratio: 25.6 mg/g (ref 0.0–30.0)

## 2013-06-10 LAB — INSULIN, FASTING: INSULIN FASTING, SERUM: 12 u[IU]/mL (ref 3–28)

## 2013-06-10 LAB — IRON AND TIBC
%SAT: 27 % (ref 20–55)
IRON: 108 ug/dL (ref 42–145)
TIBC: 405 ug/dL (ref 250–470)
UIBC: 297 ug/dL (ref 125–400)

## 2013-06-10 LAB — VITAMIN D 25 HYDROXY (VIT D DEFICIENCY, FRACTURES): VIT D 25 HYDROXY: 93 ng/mL — AB (ref 30–89)

## 2013-06-10 LAB — VITAMIN B12: Vitamin B-12: 655 pg/mL (ref 211–911)

## 2013-06-10 LAB — MAGNESIUM: Magnesium: 1.7 mg/dL (ref 1.5–2.5)

## 2013-06-10 LAB — LIPID PANEL
Cholesterol: 232 mg/dL — ABNORMAL HIGH (ref 0–200)
HDL: 44 mg/dL (ref >39)
LDL Cholesterol: 124 mg/dL — ABNORMAL HIGH (ref 0–99)
Total CHOL/HDL Ratio: 5.3 Ratio
Triglycerides: 319 mg/dL — ABNORMAL HIGH (ref <150)
VLDL: 64 mg/dL — ABNORMAL HIGH (ref 0–40)

## 2013-06-10 LAB — FERRITIN: FERRITIN: 144 ng/mL (ref 10–291)

## 2013-06-10 LAB — TSH: TSH: 4.067 u[IU]/mL (ref 0.350–4.500)

## 2013-06-10 MED ORDER — METFORMIN HCL ER (OSM) 500 MG PO TB24: 1000.0000 mg | ORAL_TABLET | Freq: Two times a day (BID) | ORAL | Status: DC

## 2013-06-10 MED ORDER — LEVOTHYROXINE SODIUM 100 MCG PO TABS
ORAL_TABLET | ORAL | Status: DC
Start: 2013-06-10 — End: 2015-05-04

## 2013-06-10 NOTE — Addendum Note (Signed)
Addended by: Quentin MullingOLLIER, Matan Steen R on: 06/10/2013 08:35 AM   Modules accepted: Orders

## 2013-07-15 ENCOUNTER — Ambulatory Visit: Payer: Self-pay | Admitting: Physician Assistant

## 2013-11-24 ENCOUNTER — Other Ambulatory Visit: Payer: Self-pay

## 2013-11-24 MED ORDER — ESCITALOPRAM OXALATE 20 MG PO TABS: ORAL_TABLET | ORAL | Status: DC

## 2013-11-24 MED ORDER — HYDROCHLOROTHIAZIDE 25 MG PO TABS: 25.0000 mg | ORAL_TABLET | Freq: Every day | ORAL | Status: DC

## 2013-12-24 ENCOUNTER — Other Ambulatory Visit: Payer: Self-pay | Admitting: Physician Assistant

## 2014-01-05 ENCOUNTER — Other Ambulatory Visit: Payer: Self-pay | Admitting: Internal Medicine

## 2014-01-05 DIAGNOSIS — Z1231 Encounter for screening mammogram for malignant neoplasm of breast: Secondary | ICD-10-CM

## 2014-01-14 ENCOUNTER — Ambulatory Visit: Payer: Self-pay | Admitting: Physician Assistant

## 2014-01-25 ENCOUNTER — Ambulatory Visit (HOSPITAL_COMMUNITY)
Admission: RE | Admit: 2014-01-25 | Discharge: 2014-01-25 | Disposition: A | Discharge status: Home / Self Care | Payer: 59 | Source: Ambulatory Visit | Attending: Internal Medicine | Admitting: Internal Medicine

## 2014-01-25 DIAGNOSIS — Z1231 Encounter for screening mammogram for malignant neoplasm of breast: Secondary | ICD-10-CM | POA: Insufficient documentation

## 2014-01-26 ENCOUNTER — Other Ambulatory Visit: Payer: Self-pay | Admitting: Physician Assistant

## 2014-02-04 ENCOUNTER — Encounter: Payer: Self-pay | Admitting: Physician Assistant

## 2014-02-04 ENCOUNTER — Ambulatory Visit (INDEPENDENT_AMBULATORY_CARE_PROVIDER_SITE_OTHER): Payer: 59 | Admitting: Physician Assistant

## 2014-02-04 VITALS — BP 110/78 | HR 72 | Temp 98.1°F | Resp 16 | Wt 246.0 lb

## 2014-02-04 DIAGNOSIS — E785 Hyperlipidemia, unspecified: Secondary | ICD-10-CM

## 2014-02-04 DIAGNOSIS — E119 Type 2 diabetes mellitus without complications: Secondary | ICD-10-CM

## 2014-02-04 DIAGNOSIS — I10 Essential (primary) hypertension: Secondary | ICD-10-CM

## 2014-02-04 DIAGNOSIS — Z79899 Other long term (current) drug therapy: Secondary | ICD-10-CM

## 2014-02-04 DIAGNOSIS — E559 Vitamin D deficiency, unspecified: Secondary | ICD-10-CM

## 2014-02-04 LAB — CBC WITH DIFFERENTIAL/PLATELET
Basophils Absolute: 0 10*3/uL (ref 0.0–0.1)
Basophils Relative: 0 % (ref 0–1)
Eosinophils Absolute: 0.1 10*3/uL (ref 0.0–0.7)
Eosinophils Relative: 1 % (ref 0–5)
HCT: 44.7 % (ref 36.0–46.0)
Hemoglobin: 15.3 g/dL — ABNORMAL HIGH (ref 12.0–15.0)
LYMPHS ABS: 3.4 10*3/uL (ref 0.7–4.0)
Lymphocytes Relative: 37 % (ref 12–46)
MCH: 29.4 pg (ref 26.0–34.0)
MCHC: 34.2 g/dL (ref 30.0–36.0)
MCV: 86 fL (ref 78.0–100.0)
Monocytes Absolute: 0.6 10*3/uL (ref 0.1–1.0)
Monocytes Relative: 6 % (ref 3–12)
Neutro Abs: 5.2 10*3/uL (ref 1.7–7.7)
Neutrophils Relative %: 56 % (ref 43–77)
Platelets: 351 10*3/uL (ref 150–400)
RBC: 5.2 MIL/uL — AB (ref 3.87–5.11)
RDW: 13.3 % (ref 11.5–15.5)
WBC: 9.2 10*3/uL (ref 4.0–10.5)

## 2014-02-04 NOTE — Progress Notes (Signed)
Assessment and Plan:  Hypertension: Continue medication, monitor blood pressure at home. Continue DASH diet.  Reminder to go to the ER if any CP, SOB, nausea, dizziness, severe HA, changes vision/speech, left arm numbness and tingling and jaw pain. Cholesterol: Continue diet and exercise. Check cholesterol.  Diabetes-Continue diet and exercise. Check A1C Vitamin D Def- check level and continue medications.  Obesity with co morbidities- long discussion about weight loss, diet, and exercise Hypothyroidism-check TSH level  Continue diet and meds as discussed. Further disposition pending results of labs. Discussed med's effects and SE's.   HPI 53 y.o. female  presents for 3 month follow up with hypertension, hyperlipidemia, diabetes and vitamin D. Her blood pressure has been controlled at home, today their BP is BP: 110/78 mmHg She does workout. She denies chest pain, shortness of breath, dizziness.  She is on cholesterol medication and denies myalgias. Her cholesterol is at goal. The cholesterol was:  06/09/2013: Cholesterol, Total 232*; HDL Cholesterol by NMR 44; LDL (calc) 124*; Triglycerides 319* She has been working on diet and exercise for Diabetes, she can not tolerate metformin, and denies paresthesia of the feet, polydipsia, polyuria and visual disturbances. Last A1C  was: 06/09/2013: Hemoglobin-A1c 7.8* Patient is on Vitamin D supplement. 06/09/2013: Vit D, 25-Hydroxy 93*  She is not on thyroid medication, she never started.  Lab Results  Component Value Date   TSH 4.067 06/09/2013  .   Current Medications:  Current Outpatient Prescriptions on File Prior to Visit  Medication Sig Dispense Refill  . cetirizine (ZYRTEC) 10 MG tablet Take 10 mg by mouth daily.    . Cholecalciferol (VITAMIN D PO) Take 2,000 Int'l Units by mouth daily.    Marland Kitchen. escitalopram (LEXAPRO) 20 MG tablet take 1/2 tablets by mouth once daily 30 tablet 0  . hydrochlorothiazide (HYDRODIURIL) 25 MG tablet take 1 tablet by  mouth once daily 30 tablet 2  . Multiple Vitamins-Minerals (MULTIVITAMIN PO) Take by mouth daily.    Marland Kitchen. levothyroxine (SYNTHROID) 100 MCG tablet 1/2-1 tablet daily 30-60 mins before food as directed by physician. 30 tablet 3   No current facility-administered medications on file prior to visit.   Medical History:  Past Medical History  Diagnosis Date  . Hypertension   . Allergy   . Anxiety   . Hyperlipidemia   . Prediabetes   . History of nephrolithiasis    Allergies:  Allergies  Allergen Reactions  . Codeine Nausea Only  . Penicillins      Review of Systems: [X]  = complains of  [ ]  = denies  General: Fatigue [ ]  Fever [ ]  Chills [ ]  Weakness [ ]   Insomnia [ ]  Eyes: Redness [ ]  Blurred vision [ ]  Diplopia [ ]   ENT: Congestion [ ]  Sinus Pain [ ]  Post Nasal Drip [ ]  Sore Throat [ ]  Earache [ ]   Cardiac: Chest pain/pressure [ ]  SOB [ ]  Orthopnea [ ]   Palpitations [ ]   Paroxysmal nocturnal dyspnea[ ]  Claudication [ ]  Edema [ ]   Pulmonary: Cough [ ]  Wheezing[ ]   SOB [ ]   Snoring [ ]   GI: Nausea [ ]  Vomiting[ ]  Dysphagia[ ]  Heartburn[ ]  Abdominal pain [ ]  Constipation [ ] ; Diarrhea [ ] ; BRBPR [ ]  Melena[ ]  GU: Hematuria[ ]  Dysuria [ ]  Nocturia[ ]  Urgency [ ]   Hesitancy [ ]  Discharge [ ]  Neuro: Headaches[ ]  Vertigo[ ]  Paresthesias[ ]  Spasm [ ]  Speech changes [ ]  Incoordination [ ]   Ortho: Arthritis [ ]  Joint pain [ ]   Muscle pain [ ]  Joint swelling [ ]  Back Pain [ ]  Skin:  Rash [ ]   Pruritis [ ]  Change in skin lesion [ ]   Psych: Depression[ ]  Anxiety[ ]  Confusion [ ]  Memory loss [ ]   Heme/Lypmh: Bleeding [ ]  Bruising [ ]  Enlarged lymph nodes [ ]   Endocrine: Visual blurring [ ]  Paresthesia [ ]  Polyuria [ ]  Polydypsea [ ]    Heat/cold intolerance [ ]  Hypoglycemia [ ]   Family history- Review and unchanged Social history- Review and unchanged Physical Exam: BP 110/78 mmHg  Pulse 72  Temp(Src) 98.1 F (36.7 C)  Resp 16  Wt 246 lb (111.585 kg) Wt Readings from Last 3 Encounters:   02/04/14 246 lb (111.585 kg)  06/09/13 255 lb (115.667 kg)   General Appearance: Well nourished, in no apparent distress. Eyes: PERRLA, EOMs, conjunctiva no swelling or erythema Sinuses: No Frontal/maxillary tenderness ENT/Mouth: Ext aud canals clear, TMs without erythema, bulging. No erythema, swelling, or exudate on post pharynx.  Tonsils not swollen or erythematous. Hearing normal.  Neck: Supple, thyroid normal.  Respiratory: Respiratory effort normal, BS equal bilaterally without rales, rhonchi, wheezing or stridor.  Cardio: RRR with no MRGs. Brisk peripheral pulses without edema.  Abdomen: Soft, + BS.  Non tender, no guarding, rebound, hernias, masses. Lymphatics: Non tender without lymphadenopathy.  Musculoskeletal: Full ROM, 5/5 strength, normal gait.  Skin: Warm, dry without rashes, lesions, ecchymosis.  Neuro: Cranial nerves intact. No cerebellar symptoms. Sensation intact.  Psych: Awake and oriented X 3, normal affect, Insight and Judgment appropriate.    Quentin Mullingollier, Amanda, PA-C 2:12 PM [facility]

## 2014-02-04 NOTE — Patient Instructions (Signed)
Please start 100 of Invokana for 1 week then go up to 300mg of Invokana. This can decrease your blood pressure so please contact us if you have any dizziness. You are peeing out 300-400 calories a day of sugar which can lead to yeast infections, you can take the diflucan as needed but if the infections continue we will stop the medications. We will have you follow up in 1 month to check your kidney function and weight. Call if you need anything.      Bad carbs also include fruit juice, alcohol, and sweet tea. These are empty calories that do not signal to your brain that you are full.   Please remember the good carbs are still carbs which convert into sugar. So please measure them out no more than 1/2-1 cup of rice, oatmeal, pasta, and beans  Veggies are however free foods! Pile them on.   Not all fruit is created equal. Please see the list below, the fruit at the bottom is higher in sugars than the fruit at the top. Please avoid all dried fruits.     

## 2014-02-05 ENCOUNTER — Encounter: Payer: Self-pay | Admitting: Physician Assistant

## 2014-02-05 LAB — BASIC METABOLIC PANEL WITH GFR
BUN: 10 mg/dL (ref 6–23)
CALCIUM: 10 mg/dL (ref 8.4–10.5)
CO2: 23 meq/L (ref 19–32)
Chloride: 99 mEq/L (ref 96–112)
Creat: 0.6 mg/dL (ref 0.50–1.10)
GFR, Est African American: >89 mL/min
GFR, Est Non African American: >89 mL/min
Glucose, Bld: 120 mg/dL — ABNORMAL HIGH (ref 70–99)
Potassium: 3.8 mEq/L (ref 3.5–5.3)
Sodium: 138 mEq/L (ref 135–145)

## 2014-02-05 LAB — HEPATIC FUNCTION PANEL
ALT: 19 U/L (ref 0–35)
AST: 18 U/L (ref 0–37)
Albumin: 4.4 g/dL (ref 3.5–5.2)
Alkaline Phosphatase: 85 U/L (ref 39–117)
Bilirubin, Direct: 0.1 mg/dL (ref 0.0–0.3)
Indirect Bilirubin: 0.3 mg/dL (ref 0.2–1.2)
Total Bilirubin: 0.4 mg/dL (ref 0.2–1.2)
Total Protein: 7.7 g/dL (ref 6.0–8.3)

## 2014-02-05 LAB — LIPID PANEL
CHOLESTEROL: 223 mg/dL — AB (ref 0–200)
HDL: 44 mg/dL (ref >39)
LDL Cholesterol: 110 mg/dL — ABNORMAL HIGH (ref 0–99)
Total CHOL/HDL Ratio: 5.1 Ratio
Triglycerides: 344 mg/dL — ABNORMAL HIGH (ref <150)
VLDL: 69 mg/dL — ABNORMAL HIGH (ref 0–40)

## 2014-02-05 LAB — MAGNESIUM: MAGNESIUM: 1.8 mg/dL (ref 1.5–2.5)

## 2014-02-05 LAB — INSULIN, FASTING: INSULIN FASTING, SERUM: 16.3 u[IU]/mL (ref 2.0–19.6)

## 2014-02-05 LAB — HEMOGLOBIN A1C
Hgb A1c MFr Bld: 6.9 % — ABNORMAL HIGH (ref <5.7)
MEAN PLASMA GLUCOSE: 151 mg/dL — AB (ref <117)

## 2014-02-05 LAB — TSH: TSH: 2.402 u[IU]/mL (ref 0.350–4.500)

## 2014-02-05 LAB — VITAMIN D 25 HYDROXY (VIT D DEFICIENCY, FRACTURES): VIT D 25 HYDROXY: 65 ng/mL (ref 30–89)

## 2014-03-30 ENCOUNTER — Other Ambulatory Visit: Payer: Self-pay | Admitting: Physician Assistant

## 2014-06-02 ENCOUNTER — Other Ambulatory Visit: Payer: Self-pay | Admitting: Internal Medicine

## 2014-06-10 ENCOUNTER — Encounter: Payer: Self-pay | Admitting: Physician Assistant

## 2014-06-26 ENCOUNTER — Other Ambulatory Visit: Payer: Self-pay | Admitting: Internal Medicine

## 2014-08-01 ENCOUNTER — Other Ambulatory Visit: Payer: Self-pay | Admitting: Internal Medicine

## 2014-09-07 ENCOUNTER — Encounter: Payer: Self-pay | Admitting: Physician Assistant

## 2014-09-16 ENCOUNTER — Encounter: Payer: Self-pay | Admitting: Physician Assistant

## 2014-09-16 ENCOUNTER — Other Ambulatory Visit (HOSPITAL_COMMUNITY)
Admission: RE | Admit: 2014-09-16 | Discharge: 2014-09-16 | Disposition: A | Discharge status: Home / Self Care | Payer: 59 | Source: Ambulatory Visit | Attending: Physician Assistant | Admitting: Physician Assistant

## 2014-09-16 ENCOUNTER — Ambulatory Visit (INDEPENDENT_AMBULATORY_CARE_PROVIDER_SITE_OTHER): Payer: 59 | Admitting: Physician Assistant

## 2014-09-16 VITALS — BP 130/72 | HR 60 | Temp 97.7°F | Resp 16 | Ht 64.5 in | Wt 250.0 lb

## 2014-09-16 DIAGNOSIS — R6889 Other general symptoms and signs: Secondary | ICD-10-CM

## 2014-09-16 DIAGNOSIS — Z79899 Other long term (current) drug therapy: Secondary | ICD-10-CM

## 2014-09-16 DIAGNOSIS — E119 Type 2 diabetes mellitus without complications: Secondary | ICD-10-CM

## 2014-09-16 DIAGNOSIS — Z124 Encounter for screening for malignant neoplasm of cervix: Secondary | ICD-10-CM

## 2014-09-16 DIAGNOSIS — Z1151 Encounter for screening for human papillomavirus (HPV): Secondary | ICD-10-CM | POA: Insufficient documentation

## 2014-09-16 DIAGNOSIS — T7840XD Allergy, unspecified, subsequent encounter: Secondary | ICD-10-CM

## 2014-09-16 DIAGNOSIS — Z1159 Encounter for screening for other viral diseases: Secondary | ICD-10-CM

## 2014-09-16 DIAGNOSIS — Z1331 Encounter for screening for depression: Secondary | ICD-10-CM

## 2014-09-16 DIAGNOSIS — E559 Vitamin D deficiency, unspecified: Secondary | ICD-10-CM

## 2014-09-16 DIAGNOSIS — Z87442 Personal history of urinary calculi: Secondary | ICD-10-CM

## 2014-09-16 DIAGNOSIS — Z01419 Encounter for gynecological examination (general) (routine) without abnormal findings: Secondary | ICD-10-CM | POA: Diagnosis not present

## 2014-09-16 DIAGNOSIS — Z6841 Body Mass Index (BMI) 40.0 and over, adult: Secondary | ICD-10-CM

## 2014-09-16 DIAGNOSIS — F419 Anxiety disorder, unspecified: Secondary | ICD-10-CM

## 2014-09-16 DIAGNOSIS — Z0000 Encounter for general adult medical examination without abnormal findings: Secondary | ICD-10-CM

## 2014-09-16 DIAGNOSIS — Z0001 Encounter for general adult medical examination with abnormal findings: Secondary | ICD-10-CM

## 2014-09-16 DIAGNOSIS — I10 Essential (primary) hypertension: Secondary | ICD-10-CM

## 2014-09-16 DIAGNOSIS — E785 Hyperlipidemia, unspecified: Secondary | ICD-10-CM

## 2014-09-16 LAB — CBC WITH DIFFERENTIAL/PLATELET
Basophils Absolute: 0.1 10*3/uL (ref 0.0–0.1)
Basophils Relative: 1 % (ref 0–1)
EOS ABS: 0.1 10*3/uL (ref 0.0–0.7)
EOS PCT: 2 % (ref 0–5)
HEMATOCRIT: 45 % (ref 36.0–46.0)
Hemoglobin: 15 g/dL (ref 12.0–15.0)
Lymphocytes Relative: 37 % (ref 12–46)
Lymphs Abs: 2.6 10*3/uL (ref 0.7–4.0)
MCH: 29.6 pg (ref 26.0–34.0)
MCHC: 33.3 g/dL (ref 30.0–36.0)
MCV: 88.8 fL (ref 78.0–100.0)
MONOS PCT: 7 % (ref 3–12)
MPV: 9.6 fL (ref 8.6–12.4)
Monocytes Absolute: 0.5 10*3/uL (ref 0.1–1.0)
Neutro Abs: 3.8 10*3/uL (ref 1.7–7.7)
Neutrophils Relative %: 53 % (ref 43–77)
Platelets: 309 10*3/uL (ref 150–400)
RBC: 5.07 MIL/uL (ref 3.87–5.11)
RDW: 13.4 % (ref 11.5–15.5)
WBC: 7.1 10*3/uL (ref 4.0–10.5)

## 2014-09-16 LAB — HEMOGLOBIN A1C
Hgb A1c MFr Bld: 7.5 % — ABNORMAL HIGH (ref <5.7)
Mean Plasma Glucose: 169 mg/dL — ABNORMAL HIGH (ref <117)

## 2014-09-16 NOTE — Progress Notes (Signed)
Complete Physical  Assessment and Plan: 1. Essential hypertension - continue medications, DASH diet, exercise and monitor at home. Call if greater than 130/80.  - CBC with Differential/Platelet - BASIC METABOLIC PANEL WITH GFR - Hepatic function panel - TSH - Urinalysis, Routine w reflex microscopic (not at [facility]) - Microalbumin / creatinine urine ratio - EKG 12-Lead  2. Diabetes mellitus without complication Discussed general issues about diabetes pathophysiology and management., Educational material distributed., Suggested low cholesterol diet., Encouraged aerobic exercise., Discussed foot care., Reminded to get yearly retinal exam. - Hemoglobin A1c - Insulin, fasting - LOW EXTREMITY NEUR EXAM DOCUM  3. Hyperlipidemia -continue medications, check lipids, decrease fatty foods, increase activity.  - Lipid panel  4. Severe obesity (BMI >= 40) Obesity with co morbidities- long discussion about weight loss, diet, and exercise  5. History of nephrolithiasis Monitor, follow up Dr. Annabell Howells  6. Anxiety Anxiety-  continue medications, stress management techniques discussed, increase water, good sleep hygiene discussed, increase exercise, and increase veggies.   7. Allergy, subsequent encounter Continue OTC allergy pills  8. Vitamin D deficiency - Vit D  25 hydroxy (rtn osteoporosis monitoring)  9. Medication management - Magnesium  10. Routine general medical examination at a health care facility Get colonoscopy  11. Screening for cervical cancer - Cytology - PAP  12. Screening for viral disease - Hepatitis C antibody - HIV antibody - RPR   Discussed med's effects and SE's. Screening labs and tests as requested with regular follow-up as recommended.  HPI 54 y.o. female  presents for a complete physical. Her blood pressure has been controlled at home, today their BP is BP: 130/72 mmHg She does workout, she does walking 2 miles 3 times a week. She denies chest pain,  shortness of breath, dizziness.  She is not on cholesterol medication and denies myalgias. Her cholesterol is not at goal. The cholesterol last visit was:  Lab Results  Component Value Date   CHOL 223* 02/04/2014   HDL 44 02/04/2014   LDLCALC 110* 02/04/2014   TRIG 344* 02/04/2014   CHOLHDL 5.1 02/04/2014   Has history of elevated LFTs Lab Results  Component Value Date   ALT 19 02/04/2014   AST 18 02/04/2014   ALKPHOS 85 02/04/2014   BILITOT 0.4 02/04/2014   She has been working on diet and exercise for diabetes without complications, recently diagnosed last year, she does not check her sugars at home, she is not on ACE/ASA, and denies foot ulcerations, increased appetite, paresthesia of the feet, polydipsia, polyuria and visual disturbances. Last A1C in the office was: Lab Results  Component Value Date   HGBA1C 6.9* 02/04/2014   It is continues to be in the diabetic range we will discuss MF and ACE.  Patient is on Vitamin D supplement.  Lab Results  Component Value Date   VD25OH 65 02/04/2014   Lab Results  Component Value Date   TSH 2.402 02/04/2014   Has had a kidney stone since last seen, on June 8th, confirmed at urgent care, still having some discomfort. Will follow up with Dr. Annabell Howells. She is on HCTZ.  She is on lexapro for anxiety which helps.  She has had poison oak x 1 week right arm and left arm.  BMI is Body mass index is 42.27 kg/(m^2)., she is working on diet and exercise. Wt Readings from Last 3 Encounters:  09/16/14 250 lb (113.399 kg)  02/04/14 246 lb (111.585 kg)  06/09/13 255 lb (115.667 kg)   Son, 20  year old,  had heart surgery for valve problem at [facility] will be doing Chief of Staff and business and he is doing well, almost back to golfing 18 holes.  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   Current Medications:  Current Outpatient Prescriptions on File Prior to Visit  Medication Sig Dispense Refill  .  cetirizine (ZYRTEC) 10 MG tablet Take 10 mg by mouth daily.    Marland Kitchen escitalopram (LEXAPRO) 20 MG tablet take 1/2 tablets by mouth once daily 30 tablet 0  . hydrochlorothiazide (HYDRODIURIL) 25 MG tablet take 1 tablet by mouth once daily 30 tablet 2  . Multiple Vitamins-Minerals (MULTIVITAMIN PO) Take by mouth daily.     No current facility-administered medications on file prior to visit.   Health Maintenance:  Immunization History  Administered Date(s) Administered  . Influenza Split 12/01/2013  . Pneumococcal Polysaccharide-23 06/09/2013  . Tdap 08/11/2007   Tetanus: 2009 Pneumovax: 2015 Prevnar 13: due at age 41 Flu vaccine: 2015 Zostavax: N/A Pap: 2013 due 2016, had abnormal pap years ago 3DMGM: 01/23/2014  DEXA: N/A Colonoscopy: Scheduling this year with Dr. Matthias Hughs EGD: N/A  Allergies:  Allergies  Allergen Reactions  . Codeine Nausea Only  . Penicillins    Medical History:  Past Medical History  Diagnosis Date  . Hypertension   . Allergy   . Anxiety   . Hyperlipidemia   . Prediabetes   . History of nephrolithiasis    Surgical History:  Past Surgical History  Procedure Laterality Date  . Tonsillectomy and adenoidectomy    . Cesarean section  1995   Family History:  Family History  Problem Relation Age of Onset  . Hypertension Mother   . Cancer Father     bladder  . Hypertension Father    Social History:  History  Substance Use Topics  . Smoking status: Former Games developer  . Smokeless tobacco: Not on file  . Alcohol Use: No   Review of Systems  Constitutional: Negative.   HENT: Negative.   Eyes: Negative.   Respiratory: Negative.   Cardiovascular: Negative.   Gastrointestinal: Negative.   Genitourinary: Positive for flank pain (right flank). Negative for dysuria, urgency, frequency and hematuria.  Musculoskeletal: Negative.   Skin: Negative.   Neurological: Negative.   Endo/Heme/Allergies: Negative.   Psychiatric/Behavioral: Negative.  Negative for  depression.    Physical Exam: Estimated body mass index is 42.27 kg/(m^2) as calculated from the following:   Height as of this encounter: 5' 4.5" (1.638 m).   Weight as of this encounter: 250 lb (113.399 kg).  Filed Vitals:   09/16/14 1009  BP: 130/72  Pulse: 60  Temp: 97.7 F (36.5 C)  Resp: 16   General Appearance: Well nourished, in no apparent distress. Eyes: PERRLA, EOMs, conjunctiva no swelling or erythema, normal fundi and vessels. Sinuses: No Frontal/maxillary tenderness ENT/Mouth: Ext aud canals clear, normal light reflex with TMs without erythema, bulging.  Good dentition. No erythema, swelling, or exudate on post pharynx. Tonsils not swollen or erythematous. Hearing normal.  Neck: Supple, thyroid normal. No bruits Respiratory: Respiratory effort normal, BS equal bilaterally without rales, rhonchi, wheezing or stridor. Cardio: RRR without murmurs, rubs or gallops. Brisk peripheral pulses without edema.  Chest: symmetric, with normal excursions and percussion. Breasts: defer Abdomen: Soft, +BS, obese Non tender, no guarding, rebound, hernias, masses, or organomegaly. .  Lymphatics: Non tender without lymphadenopathy.  Genitourinary: VULVA: vulvar hypopigmentation and slight erythema, VAGINA: atrophic, PELVIC FLOOR EXAM:  no cystocele, rectocele or prolapse noted, PAP: Pap smear done today. Musculoskeletal: Full ROM all peripheral extremities,5/5 strength, and normal gait. Skin: Warm, dry without rashes, lesions, ecchymosis.  Neuro: Cranial nerves intact, reflexes equal bilaterally. Normal muscle tone, no cerebellar symptoms. Sensation intact.  Psych: Awake and oriented X 3, normal affect, Insight and Judgment appropriate.   EKG: WNL no changes.    Quentin Mulling 10:30 AM

## 2014-09-16 NOTE — Patient Instructions (Signed)
Add ENTERIC COATED low dose 81 mg Aspirin daily OR can do every other day if you have easy bruising to protect your heart and head. As well as to reduce risk of Colon Cancer by 20 %, Skin Cancer by 26 % , Melanoma by 46% and Pancreatic cancer by 60%  Diabetes is a very complicated disease...lets simplify it.  An easy way to look at it to understand the complications is if you think of the extra sugar floating in your blood stream as glass shards floating through your blood stream.    Diabetes affects your small vessels first: 1) The glass shards (sugar) scraps down the tiny blood vessels in your eyes and lead to diabetic retinopathy, the leading cause of blindness in the [place]. Diabetes is the leading cause of newly diagnosed adult (59 to 52 years of age) blindness in the [place].  2) The glass shards scratches down the tiny vessels of your legs leading to nerve damage called neuropathy and can lead to amputations of your feet. More than 60% of all non-traumatic amputations of lower limbs occur in people with diabetes.  3) Over time the small vessels in your brain are shredded and closed off, individually this does not cause any problems but over a long period of time many of the small vessels being blocked can lead to Vascular Dementia.   4) Your kidney's are a filter system and have a "net" that keeps certain things in the body and lets bad things out. Sugar shreds this net and leads to kidney damage and eventually failure. Decreasing the sugar that is destroying the net and certain blood pressure medications can help stop or decrease progression of kidney disease. Diabetes was the primary cause of kidney failure in 44 percent of all new cases in 2011.  5) Diabetes also destroys the small vessels in your penis that lead to erectile dysfunction. Eventually the vessels are so damaged that you may not be responsive to cialis or viagra.   Diabetes and your large vessels: Your larger vessels  consist of your coronary arteries in your heart and the carotid vessels to your brain. Diabetes or even increased sugars put you at 300% increased risk of heart attack and stroke and this is why.. The sugar scrapes down your large blood vessels and your body sees this as an internal injury and tries to repair itself. Just like you get a scab on your skin, your platelets will stick to the blood vessel wall trying to heal it. This is why we have diabetics on low dose aspirin daily, this prevents the platelets from sticking and can prevent plaque formation. In addition, your body takes cholesterol and tries to shove it into the open wound. This is why we want your LDL, or bad cholesterol, below 70.   The combination of platelets and cholesterol over 5-10 years forms plaque that can break off and cause a heart attack or stroke.   PLEASE REMEMBER:  Diabetes is preventable! Up to 85 percent of complications and morbidities among individuals with type 2 diabetes can be prevented, delayed, or effectively treated and minimized with regular visits to a health professional, appropriate monitoring and medication, and a healthy diet and lifestyle.  Before you even begin to attack a weight-loss plan, it pays to remember this: You are not fat. You have fat. Losing weight isn't about blame or shame; it's simply another achievement to accomplish. Dieting is like any other skill-you have to buckle down and work at  it. As long as you act in a smart, reasonable way, you'll ultimately get where you want to be. Here are some weight loss pearls for you.  1. It's Not a Diet. It's a Lifestyle Thinking of a diet as something you're on and suffering through only for the short term doesn't work. To shed weight and keep it off, you need to make permanent changes to the way you eat. It's OK to indulge occasionally, of course, but if you cut calories temporarily and then revert to your old way of eating, you'll gain back the weight  quicker than you can say yo-yo. Use it to lose it. Research shows that one of the best predictors of long-term weight loss is how many pounds you drop in the first month. For that reason, nutritionists often suggest being stricter for the first two weeks of your new eating strategy to build momentum. Cut out added sugar and alcohol and avoid unrefined carbs. After that, figure out how you can reincorporate them in a way that's healthy and maintainable.  2. There's a Right Way to Exercise Working out burns calories and fat and boosts your metabolism by building muscle. But those trying to lose weight are notorious for overestimating the number of calories they burn and underestimating the amount they take in. Unfortunately, your system is biologically programmed to hold on to extra pounds and that means when you start exercising, your body senses the deficit and ramps up its hunger signals. If you're not diligent, you'll eat everything you burn and then some. Use it to lose it. Cardio gets all the exercise glory, but strength and interval training are the real heroes. They help you build lean muscle, which in turn increases your metabolism and calorie-burning ability 3. Don't Overreact to Mild Hunger Some people have a hard time losing weight because of hunger anxiety. To them, being hungry is bad-something to be avoided at all costs-so they carry snacks with them and eat when they don't need to. Others eat because they're stressed out or bored. While you never want to get to the point of being ravenous (that's when bingeing is likely to happen), a hunger pang, a craving, or the fact that it's 3:00 p.m. should not send you racing for the vending machine or obsessing about the energy bar in your purse. Ideally, you should put off eating until your stomach is growling and it's difficult to concentrate.  Use it to lose it. When you feel the urge to eat, use the HALT method. Ask yourself, Am I really hungry? Or am  I angry or anxious, lonely or bored, or tired? If you're still not certain, try the apple test. If you're truly hungry, an apple should seem delicious; if it doesn't, something else is going on. Or you can try drinking water and making yourself busy, if you are still hungry try a healthy snack.  4. Not All Calories Are Created Equal The mechanics of weight loss are pretty simple: Take in fewer calories than you use for energy. But the kind of food you eat makes all the difference. Processed food that's high in saturated fat and refined starch or sugar can cause inflammation that disrupts the hormone signals that tell your brain you're full. The result: You eat a lot more.  Use it to lose it. Clean up your diet. Swap in whole, unprocessed foods, including vegetables, lean protein, and healthy fats that will fill you up and give you the biggest nutritional bang for  your calorie buck. In a few weeks, as your brain starts receiving regular hunger and fullness signals once again, you'll notice that you feel less hungry overall and naturally start cutting back on the amount you eat.  5. Protein, Produce, and Plant-Based Fats Are Your Weight-Loss Trinity Here's why eating the three Ps regularly will help you drop pounds. Protein fills you up. You need it to build lean muscle, which keeps your metabolism humming so that you can torch more fat. People in a weight-loss program who ate double the recommended daily allowance for protein (about 110 grams for a 150-pound woman) lost 70 percent of their weight from fat, while people who ate the RDA lost only about 40 percent, one study found. Produce is packed with filling fiber. "It's very difficult to consume too many calories if you're eating a lot of vegetables. Example: Three cups of broccoli is a lot of food, yet only 93 calories. (Fruit is another story. It can be easy to overeat and can contain a lot of calories from sugar, so be sure to monitor your  intake.) Plant-based fats like olive oil and those in avocados and nuts are healthy and extra satiating.  Use it to lose it. Aim to incorporate each of the three Ps into every meal and snack. People who eat protein throughout the day are able to keep weight off, according to a study in the American Journal of Clinical Nutrition. In addition to meat, poultry and seafood, good sources are beans, lentils, eggs, tofu, and yogurt. As for fat, keep portion sizes in check by measuring out salad dressing, oil, and nut butters (shoot for one to two tablespoons). Finally, eat veggies or a little fruit at every meal. People who did that consumed 308 fewer calories but didn't feel any hungrier than when they didn't eat more produce.  7. How You Eat Is As Important As What You Eat In order for your brain to register that you're full, you need to focus on what you're eating. Sit down whenever you eat, preferably at a table. Turn off the TV or computer, put down your phone, and look at your food. Smell it. Chew slowly, and don't put another bite on your fork until you swallow. When women ate lunch this attentively, they consumed 30 percent less when snacking later than those who listened to an audiobook at lunchtime, according to a study in the [place] Journal of Nutrition. 8. Weighing Yourself Really Works The scale provides the best evidence about whether your efforts are paying off. Seeing the numbers tick up or down or stagnate is motivation to keep going-or to rethink your approach. A 2015 study at [facility] found that daily weigh-ins helped people lose more weight, keep it off, and maintain that loss, even after two years. Use it to lose it. Step on the scale at the same time every day for the best results. If your weight shoots up several pounds from one weigh-in to the next, don't freak out. Eating a lot of salt the night before or having your period is the likely culprit. The number should return to normal  in a day or two. It's a steady climb that you need to do something about. 9. Too Much Stress and Too Little Sleep Are Your Enemies When you're tired and frazzled, your body cranks up the production of cortisol, the stress hormone that can cause carb cravings. Not getting enough sleep also boosts your levels of ghrelin, a hormone associated with  hunger, while suppressing leptin, a hormone that signals fullness and satiety. People on a diet who slept only five and a half hours a night for two weeks lost 55 percent less fat and were hungrier than those who slept eight and a half hours, according to a study in the [place] [employer] Journal. Use it to lose it. Prioritize sleep, aiming for seven hours or more a night, which research shows helps lower stress. And make sure you're getting quality zzz's. If a snoring spouse or a fidgety cat wakes you up frequently throughout the night, you may end up getting the equivalent of just four hours of sleep, according to a study from [facility]. Keep pets out of the bedroom, and use a white-noise app to drown out snoring. 10. You Will Hit a plateau-And You Can Bust Through It As you slim down, your body releases much less leptin, the fullness hormone.  If you're not strength training, start right now. Building muscle can raise your metabolism to help you overcome a plateau. To keep your body challenged and burning calories, incorporate new moves and more intense intervals into your workouts or add another sweat session to your weekly routine. Alternatively, cut an extra 100 calories or so a day from your diet. Now that you've lost weight, your body simply doesn't need as much fuel.   Ways to cut 100 calories  1. Eat your eggs with hot sauce OR salsa instead of cheese.  Eggs are great for breakfast, but many people consider eggs and cheese to be BFFs. Instead of cheese-1 oz. of cheddar has 114 calories-top your eggs with hot sauce, which contains no  calories and helps with satiety and metabolism. Salsa is also a great option!!  2. Top your toast, waffles or pancakes with mashed berries instead of jelly or syrup. Half a cup of berries-fresh, frozen or thawed-has about 40 calories, compared with 2 tbsp. of maple syrup or jelly, which both have about 100 calories. The berries will also give you a good punch of fiber, which helps keep you full and satisfied and won't spike blood sugar quickly like the jelly or syrup. 3. Swap the non-fat latte for black coffee with a splash of half-and-half. Contrary to its name, that non-fat latte has 130 calories and a startling 19g of carbohydrates per 16 oz. serving. Replacing that 'light' drinkable dessert with a black coffee with a splash of half-and-half saves you more than 100 calories per 16 oz. serving. 4. Sprinkle salads with freeze-dried raspberries instead of dried cranberries. If you want a sweet addition to your nutritious salad, stay away from dried cranberries. They have a whopping 130 calories per  cup and 30g carbohydrates. Instead, sprinkle freeze-dried raspberries guilt-free and save more than 100 calories per  cup serving, adding 3g of belly-filling fiber. 5. Go for mustard in place of mayo on your sandwich. Mustard can add really nice flavor to any sandwich, and there are tons of varieties, from spicy to honey. A serving of mayo is 95 calories, versus 10 calories in a serving of mustard. 6. Choose a DIY salad dressing instead of the store-bought kind. Mix Dijon or whole grain mustard with low-fat Kefir or red wine vinegar and garlic. 7. Use hummus as a spread instead of a dip. Use hummus as a spread on a high-fiber cracker or tortilla with a sandwich and save on calories without sacrificing taste. 8. Pick just one salad "accessory." Salad isn't automatically a calorie winner. It's easy  to over-accessorize with toppings. Instead of topping your salad with nuts, avocado and cranberries (all three  will clock in at 313 calories), just pick one. The next day, choose a different accessory, which will also keep your salad interesting. You don't wear all your jewelry every day, right? 9. Ditch the white pasta in favor of spaghetti squash. One cup of cooked spaghetti squash has about 40 calories, compared with traditional spaghetti, which comes with more than 200. Spaghetti squash is also nutrient-dense. It's a good source of fiber and Vitamins A and C, and it can be eaten just like you would eat pasta-with a great tomato sauce and [place] meatballs or with pesto, tofu and spinach, for example. 10. Dress up your chili, soups and stews with non-fat [place] yogurt instead of sour cream. Just a 'dollop' of sour cream can set you back 115 calories and a whopping 12g of fat-seven of which are of the artery-clogging variety. Added bonus: [place] yogurt is packed with muscle-building protein, calcium and B Vitamins. 11. Mash cauliflower instead of mashed potatoes. One cup of traditional mashed potatoes-in all their creamy goodness-has more than 200 calories, compared to mashed cauliflower, which you can typically eat for less than 100 calories per 1 cup serving. Cauliflower is a great source of the antioxidant indole-3-carbinol (I3C), which may help reduce the risk of some cancers, like breast cancer. 12. Ditch the ice cream sundae in favor of a [place] yogurt parfait. Instead of a cup of ice cream or fro-yo for dessert, try 1 cup of nonfat Greek yogurt topped with fresh berries and a sprinkle of cacao nibs. Both toppings are packed with antioxidants, which can help reduce cellular inflammation and oxidative damage. And the comparison is a no-brainer: One cup of ice cream has about 275 calories; one cup of frozen yogurt has about 230; and a cup of Greek yogurt has just 130, plus twice the protein, so you're less likely to return to the freezer for a second helping. 13. Put olive oil in a spray container instead of using  it directly from the bottle. Each tablespoon of olive oil is 120 calories and 15g of fat. Use a mister instead of pouring it straight into the pan or onto a salad. This allows for portion control and will save you more than 100 calories. 14. When baking, substitute canned pumpkin for butter or oil. Canned pumpkin-not pumpkin pie mix-is loaded with Vitamin A, which is important for skin and eye health, as well as immunity. And the comparisons are pretty crazy:  cup of canned pumpkin has about 40 calories, compared to butter or oil, which has more than 800 calories. Yes, 800 calories. Applesauce and mashed banana can also serve as good substitutions for butter or oil, usually in a 1:1 ratio. 15. Top casseroles with high-fiber cereal instead of breadcrumbs. Breadcrumbs are typically made with white bread, while breakfast cereals contain 5-9g of fiber per serving. Not only will you save more than 150 calories per  cup serving, the swap will also keep you more full and you'll get a metabolism boost from the added fiber. 16. Snack on pistachios instead of macadamia nuts. Believe it or not, you get the same amount of calories from 35 pistachios (100 calories) as you would from only five macadamia nuts. 17. Chow down on kale chips rather than potato chips. This is my favorite 'don't knock it 'till you try it' swap. Kale chips are so easy to make at home, and you can spice  them up with a little grated parmesan or chili powder. Plus, they're a mere fraction of the calories of potato chips, but with the same crunch factor we crave so often. 18. Add seltzer and some fruit slices to your cocktail instead of soda or fruit juice. One cup of soda or fruit juice can pack on as much as 140 calories. Instead, use seltzer and fruit slices. The fruit provides valuable phytochemicals, such as flavonoids and anthocyanins, which help to combat cancer and stave off the aging process.  .   Bad carbs also include fruit  juice, alcohol, and sweet tea. These are empty calories that do not signal to your brain that you are full.   Please remember the good carbs are still carbs which convert into sugar. So please measure them out no more than 1/2-1 cup of rice, oatmeal, pasta, and beans  Veggies are however free foods! Pile them on.   Not all fruit is created equal. Please see the list below, the fruit at the bottom is higher in sugars than the fruit at the top. Please avoid all dried fruits.

## 2014-09-17 LAB — URINALYSIS, ROUTINE W REFLEX MICROSCOPIC
BILIRUBIN URINE: NEGATIVE
Glucose, UA: NEGATIVE mg/dL
Hgb urine dipstick: NEGATIVE
Ketones, ur: NEGATIVE mg/dL
Leukocytes, UA: NEGATIVE
Nitrite: NEGATIVE
PH: 6.5 (ref 5.0–8.0)
Protein, ur: NEGATIVE mg/dL
SPECIFIC GRAVITY, URINE: 1.008 (ref 1.005–1.030)
Urobilinogen, UA: 0.2 mg/dL (ref 0.0–1.0)

## 2014-09-17 LAB — MAGNESIUM: Magnesium: 1.9 mg/dL (ref 1.5–2.5)

## 2014-09-17 LAB — HEPATIC FUNCTION PANEL
ALK PHOS: 82 U/L (ref 39–117)
ALT: 26 U/L (ref 0–35)
AST: 18 U/L (ref 0–37)
Albumin: 4.3 g/dL (ref 3.5–5.2)
BILIRUBIN INDIRECT: 0.3 mg/dL (ref 0.2–1.2)
BILIRUBIN TOTAL: 0.4 mg/dL (ref 0.2–1.2)
Bilirubin, Direct: 0.1 mg/dL (ref 0.0–0.3)
TOTAL PROTEIN: 7.1 g/dL (ref 6.0–8.3)

## 2014-09-17 LAB — LIPID PANEL
CHOLESTEROL: 218 mg/dL — AB (ref 0–200)
HDL: 40 mg/dL — ABNORMAL LOW (ref >=46)
LDL Cholesterol: 122 mg/dL — ABNORMAL HIGH (ref 0–99)
TRIGLYCERIDES: 282 mg/dL — AB (ref <150)
Total CHOL/HDL Ratio: 5.5 Ratio
VLDL: 56 mg/dL — AB (ref 0–40)

## 2014-09-17 LAB — MICROALBUMIN / CREATININE URINE RATIO
CREATININE, URINE: 40.2 mg/dL
Microalb, Ur: <0.2 mg/dL (ref <2.0)

## 2014-09-17 LAB — RPR

## 2014-09-17 LAB — HIV ANTIBODY (ROUTINE TESTING W REFLEX): HIV 1&2 Ab, 4th Generation: NONREACTIVE

## 2014-09-17 LAB — BASIC METABOLIC PANEL WITH GFR
BUN: 12 mg/dL (ref 6–23)
CHLORIDE: 99 meq/L (ref 96–112)
CO2: 25 mEq/L (ref 19–32)
Calcium: 9.4 mg/dL (ref 8.4–10.5)
Creat: 0.65 mg/dL (ref 0.50–1.10)
GFR, Est African American: >89 mL/min
Glucose, Bld: 137 mg/dL — ABNORMAL HIGH (ref 70–99)
POTASSIUM: 3.8 meq/L (ref 3.5–5.3)
Sodium: 137 mEq/L (ref 135–145)

## 2014-09-17 LAB — VITAMIN D 25 HYDROXY (VIT D DEFICIENCY, FRACTURES): Vit D, 25-Hydroxy: 42 ng/mL (ref 30–100)

## 2014-09-17 LAB — TSH: TSH: 1.536 u[IU]/mL (ref 0.350–4.500)

## 2014-09-17 LAB — HEPATITIS C ANTIBODY: HCV Ab: NEGATIVE

## 2014-09-17 LAB — INSULIN, FASTING: Insulin fasting, serum: 14.9 u[IU]/mL (ref 2.0–19.6)

## 2014-09-20 LAB — CYTOLOGY - PAP

## 2014-09-27 ENCOUNTER — Other Ambulatory Visit: Payer: Self-pay

## 2014-09-29 ENCOUNTER — Other Ambulatory Visit: Payer: Self-pay | Admitting: Physician Assistant

## 2014-09-29 ENCOUNTER — Other Ambulatory Visit: Payer: Self-pay | Admitting: Internal Medicine

## 2014-10-25 ENCOUNTER — Encounter: Payer: Self-pay | Admitting: Physician Assistant

## 2015-01-29 ENCOUNTER — Other Ambulatory Visit: Payer: Self-pay | Admitting: Internal Medicine

## 2015-01-29 DIAGNOSIS — I10 Essential (primary) hypertension: Secondary | ICD-10-CM

## 2015-03-02 ENCOUNTER — Other Ambulatory Visit: Payer: Self-pay | Admitting: Internal Medicine

## 2015-03-02 ENCOUNTER — Encounter: Payer: Self-pay | Admitting: Physician Assistant

## 2015-03-02 ENCOUNTER — Ambulatory Visit (INDEPENDENT_AMBULATORY_CARE_PROVIDER_SITE_OTHER): Payer: 59 | Admitting: Physician Assistant

## 2015-03-02 VITALS — BP 124/68 | HR 83 | Temp 98.1°F | Resp 16 | Ht 64.5 in | Wt 254.0 lb

## 2015-03-02 DIAGNOSIS — E785 Hyperlipidemia, unspecified: Secondary | ICD-10-CM

## 2015-03-02 DIAGNOSIS — E559 Vitamin D deficiency, unspecified: Secondary | ICD-10-CM

## 2015-03-02 DIAGNOSIS — Z23 Encounter for immunization: Secondary | ICD-10-CM

## 2015-03-02 DIAGNOSIS — Z79899 Other long term (current) drug therapy: Secondary | ICD-10-CM

## 2015-03-02 DIAGNOSIS — E119 Type 2 diabetes mellitus without complications: Secondary | ICD-10-CM | POA: Diagnosis not present

## 2015-03-02 DIAGNOSIS — I10 Essential (primary) hypertension: Secondary | ICD-10-CM | POA: Diagnosis not present

## 2015-03-02 LAB — CBC WITH DIFFERENTIAL/PLATELET
BASOS ABS: 0 10*3/uL (ref 0.0–0.1)
Basophils Relative: 0 % (ref 0–1)
Eosinophils Absolute: 0.2 10*3/uL (ref 0.0–0.7)
Eosinophils Relative: 2 % (ref 0–5)
HEMATOCRIT: 42.9 % (ref 36.0–46.0)
HEMOGLOBIN: 14.1 g/dL (ref 12.0–15.0)
LYMPHS PCT: 43 % (ref 12–46)
Lymphs Abs: 3.7 10*3/uL (ref 0.7–4.0)
MCH: 29.1 pg (ref 26.0–34.0)
MCHC: 32.9 g/dL (ref 30.0–36.0)
MCV: 88.5 fL (ref 78.0–100.0)
MPV: 9.3 fL (ref 8.6–12.4)
Monocytes Absolute: 0.7 10*3/uL (ref 0.1–1.0)
Monocytes Relative: 8 % (ref 3–12)
NEUTROS ABS: 4 10*3/uL (ref 1.7–7.7)
NEUTROS PCT: 47 % (ref 43–77)
Platelets: 332 10*3/uL (ref 150–400)
RBC: 4.85 MIL/uL (ref 3.87–5.11)
RDW: 13.4 % (ref 11.5–15.5)
WBC: 8.5 10*3/uL (ref 4.0–10.5)

## 2015-03-02 LAB — TSH: TSH: 2.704 u[IU]/mL (ref 0.350–4.500)

## 2015-03-02 MED ORDER — HYDROCHLOROTHIAZIDE 25 MG PO TABS: ORAL_TABLET | ORAL | Status: DC

## 2015-03-02 MED ORDER — METFORMIN HCL ER 500 MG PO TB24: ORAL_TABLET | ORAL | Status: DC

## 2015-03-02 MED ORDER — ASPIRIN EC 81 MG PO TBEC: 81.0000 mg | DELAYED_RELEASE_TABLET | Freq: Every day | ORAL | Status: AC

## 2015-03-02 NOTE — Patient Instructions (Addendum)
DON'T DRINK ANY OF YOUR CALORIES DRINK 80-100 OZ OF WATER A DAY  We are starting you on Metformin to prevent or treat diabetes. Metformin does not cause low blood sugars. In order to create energy your cells need insulin and sugar but sometime your cells do not accept the insulin and this can cause increased sugars and decreased energy. The Metformin helps your cells accept insulin and the sugar to give you more energy.   The two most common side effects are nausea and diarrhea, follow these rules to avoid it! You can take imodium per box instructions when starting metformin if needed.   Rules of metformin: 1) start out slow with only one pill daily. Our goal for you is 4 pills a day or 2000mg  total.  2) take with your largest meal. 3) Take with least amount of carbs.   Call if you have any problems.   Diabetes is a very complicated disease...lets simplify it.  An easy way to look at it to understand the complications is if you think of the extra sugar floating in your blood stream as glass shards floating through your blood stream.    Diabetes affects your small vessels first: 1) The glass shards (sugar) scraps down the tiny blood vessels in your eyes and lead to diabetic retinopathy, the leading cause of blindness in the [place]. Diabetes is the leading cause of newly diagnosed adult (5320 to 52 years of age) blindness in the [place].  2) The glass shards scratches down the tiny vessels of your legs leading to nerve damage called neuropathy and can lead to amputations of your feet. More than 60% of all non-traumatic amputations of lower limbs occur in people with diabetes.  3) Over time the small vessels in your brain are shredded and closed off, individually this does not cause any problems but over a long period of time many of the small vessels being blocked can lead to Vascular Dementia.   4) Your kidney's are a filter system and have a "net" that keeps certain things in the body and  lets bad things out. Sugar shreds this net and leads to kidney damage and eventually failure. Decreasing the sugar that is destroying the net and certain blood pressure medications can help stop or decrease progression of kidney disease. Diabetes was the primary cause of kidney failure in 44 percent of all new cases in 2011.  5) Diabetes also destroys the small vessels in your penis that lead to erectile dysfunction. Eventually the vessels are so damaged that you may not be responsive to cialis or viagra.   Diabetes and your large vessels: Your larger vessels consist of your coronary arteries in your heart and the carotid vessels to your brain. Diabetes or even increased sugars put you at 300% increased risk of heart attack and stroke and this is why.. The sugar scrapes down your large blood vessels and your body sees this as an internal injury and tries to repair itself. Just like you get a scab on your skin, your platelets will stick to the blood vessel wall trying to heal it. This is why we have diabetics on low dose aspirin daily, this prevents the platelets from sticking and can prevent plaque formation. In addition, your body takes cholesterol and tries to shove it into the open wound. This is why we want your LDL, or bad cholesterol, below 70.   The combination of platelets and cholesterol over 5-10 years forms plaque that can break off  and cause a heart attack or stroke.   PLEASE REMEMBER:  Diabetes is preventable! Up to 35 percent of complications and morbidities among individuals with type 2 diabetes can be prevented, delayed, or effectively treated and minimized with regular visits to a health professional, appropriate monitoring and medication, and a healthy diet and lifestyle.     Bad carbs also include fruit juice, alcohol, and sweet tea. These are empty calories that do not signal to your brain that you are full.   Please remember the good carbs are still carbs which convert into  sugar. So please measure them out no more than 1/2-1 cup of rice, oatmeal, pasta, and beans  Veggies are however free foods! Pile them on.   Not all fruit is created equal. Please see the list below, the fruit at the bottom is higher in sugars than the fruit at the top. Please avoid all dried fruits.

## 2015-03-02 NOTE — Progress Notes (Signed)
Assessment and Plan:  Hypertension: Continue medication, monitor blood pressure at home. Continue DASH diet.  Reminder to go to the ER if any CP, SOB, nausea, dizziness, severe HA, changes vision/speech, left arm numbness and tingling and jaw pain. Cholesterol: Continue diet and exercise. Check cholesterol.  Diabetes-Continue diet and exercise. Check A1C Vitamin D Def- check level and continue medications.  Obesity with co morbidities- long discussion about weight loss, diet, and exercise Hypothyroidism-check TSH level  Continue diet and meds as discussed. Further disposition pending results of labs. Discussed med's effects and SE's.   HPI 54 y.o. female  presents for 3 month follow up with hypertension, hyperlipidemia, diabetes and vitamin D. Her blood pressure has been controlled at home, today their BP is BP: 124/68 mmHg She does workout. She denies chest pain, shortness of breath, dizziness.  She is on cholesterol medication and denies myalgias. Her cholesterol is at goal. The cholesterol was:  09/16/2014: Cholesterol 218*; HDL 40*; LDL Cholesterol 122*; Triglycerides 282* She has been working on diet and exercise for Diabetes, she would like to retry the metformin, and denies paresthesia of the feet, polydipsia, polyuria and visual disturbances. Last A1C  was: 09/16/2014: Hgb A1c MFr Bld 7.5*    Lab Results  Component Value Date   GFRNONAA >89 09/16/2014  Patient is on Vitamin D supplement. 09/16/2014: Vit D, 25-Hydroxy 42  She is not on thyroid medication, she never started.  Lab Results  Component Value Date   TSH 1.536 09/16/2014  .   Current Medications:  Current Outpatient Prescriptions on File Prior to Visit  Medication Sig Dispense Refill  . cetirizine (ZYRTEC) 10 MG tablet Take 10 mg by mouth daily.    Marland Kitchen escitalopram (LEXAPRO) 20 MG tablet take 1/2 tablet by mouth once daily 30 tablet 5  . hydrochlorothiazide (HYDRODIURIL) 25 MG tablet take 1 tablet by mouth once daily  (NEED OFFICE VISIT 30 tablet 0  . Multiple Vitamins-Minerals (MULTIVITAMIN PO) Take by mouth daily.     No current facility-administered medications on file prior to visit.   Medical History:  Past Medical History  Diagnosis Date  . Hypertension   . Allergy   . Anxiety   . Hyperlipidemia   . Prediabetes   . History of nephrolithiasis    Allergies:  Allergies  Allergen Reactions  . Codeine Nausea Only  . Penicillins     Review of Systems  Constitutional: Negative.   HENT: Negative.   Eyes: Negative.   Respiratory: Negative.   Cardiovascular: Negative.   Gastrointestinal: Negative.   Genitourinary: Negative.   Musculoskeletal: Negative.   Skin: Negative.   Neurological: Negative.   Endo/Heme/Allergies: Negative.   Psychiatric/Behavioral: Negative.      Family history- Review and unchanged Social history- Review and unchanged Physical Exam: BP 124/68 mmHg  Pulse 83  Temp(Src) 98.1 F (36.7 C) (Temporal)  Resp 16  Ht 5' 4.5" (1.638 m)  Wt 254 lb (115.214 kg)  BMI 42.94 kg/m2  SpO2 99% Wt Readings from Last 3 Encounters:  03/02/15 254 lb (115.214 kg)  09/16/14 250 lb (113.399 kg)  02/04/14 246 lb (111.585 kg)   General Appearance: Well nourished, in no apparent distress. Eyes: PERRLA, EOMs, conjunctiva no swelling or erythema Sinuses: No Frontal/maxillary tenderness ENT/Mouth: Ext aud canals clear, TMs without erythema, bulging. No erythema, swelling, or exudate on post pharynx.  Tonsils not swollen or erythematous. Hearing normal.  Neck: Supple, thyroid normal.  Respiratory: Respiratory effort normal, BS equal bilaterally without rales, rhonchi, wheezing or  stridor.  Cardio: RRR with no MRGs. Brisk peripheral pulses without edema.  Abdomen: Soft, + BS.  Non tender, no guarding, rebound, hernias, masses. Lymphatics: Non tender without lymphadenopathy.  Musculoskeletal: Full ROM, 5/5 strength, normal gait.  Skin: Warm, dry without rashes, lesions, ecchymosis.   Neuro: Cranial nerves intact. No cerebellar symptoms. Sensation intact.  Psych: Awake and oriented X 3, normal affect, Insight and Judgment appropriate.    Quentin MullingAmanda Mariane Burpee, PA-C 4:00 PM [facility]

## 2015-03-02 NOTE — Addendum Note (Signed)
Addended by: Quentin MullingOLLIER, Awais Cobarrubias R on: 03/02/2015 04:22 PM   Modules accepted: Orders

## 2015-03-03 LAB — BASIC METABOLIC PANEL WITH GFR
BUN: 10 mg/dL (ref 7–25)
CO2: 25 mmol/L (ref 20–31)
Calcium: 9.1 mg/dL (ref 8.6–10.4)
Chloride: 100 mmol/L (ref 98–110)
Creat: 0.58 mg/dL (ref 0.50–1.05)
GFR, Est African American: >89 mL/min (ref >=60)
GLUCOSE: 120 mg/dL — AB (ref 65–99)
POTASSIUM: 3.7 mmol/L (ref 3.5–5.3)
SODIUM: 137 mmol/L (ref 135–146)

## 2015-03-03 LAB — LIPID PANEL
CHOL/HDL RATIO: 7.8 ratio — AB (ref <=5.0)
Cholesterol: 248 mg/dL — ABNORMAL HIGH (ref 125–200)
HDL: 32 mg/dL — ABNORMAL LOW (ref >=46)
TRIGLYCERIDES: 503 mg/dL — AB (ref <150)

## 2015-03-03 LAB — HEPATIC FUNCTION PANEL
ALK PHOS: 74 U/L (ref 33–130)
ALT: 18 U/L (ref 6–29)
AST: 14 U/L (ref 10–35)
Albumin: 4.1 g/dL (ref 3.6–5.1)
TOTAL PROTEIN: 7 g/dL (ref 6.1–8.1)
Total Bilirubin: 0.4 mg/dL (ref 0.2–1.2)

## 2015-03-03 LAB — VITAMIN D 25 HYDROXY (VIT D DEFICIENCY, FRACTURES): Vit D, 25-Hydroxy: 29 ng/mL — ABNORMAL LOW (ref 30–100)

## 2015-03-03 LAB — MAGNESIUM: MAGNESIUM: 1.9 mg/dL (ref 1.5–2.5)

## 2015-03-03 LAB — HEMOGLOBIN A1C
Hgb A1c MFr Bld: 7.4 % — ABNORMAL HIGH (ref <5.7)
Mean Plasma Glucose: 166 mg/dL — ABNORMAL HIGH (ref <117)

## 2015-04-23 ENCOUNTER — Encounter: Payer: Self-pay | Admitting: *Deleted

## 2015-05-04 ENCOUNTER — Other Ambulatory Visit: Payer: Self-pay | Admitting: Physician Assistant

## 2015-06-02 ENCOUNTER — Ambulatory Visit (INDEPENDENT_AMBULATORY_CARE_PROVIDER_SITE_OTHER): Payer: 59 | Admitting: Physician Assistant

## 2015-06-02 ENCOUNTER — Encounter: Payer: Self-pay | Admitting: Physician Assistant

## 2015-06-02 VITALS — BP 130/80 | HR 71 | Temp 97.5°F | Resp 16 | Ht 63.5 in | Wt 243.2 lb

## 2015-06-02 DIAGNOSIS — E559 Vitamin D deficiency, unspecified: Secondary | ICD-10-CM | POA: Diagnosis not present

## 2015-06-02 DIAGNOSIS — I10 Essential (primary) hypertension: Secondary | ICD-10-CM

## 2015-06-02 DIAGNOSIS — Z79899 Other long term (current) drug therapy: Secondary | ICD-10-CM

## 2015-06-02 DIAGNOSIS — E119 Type 2 diabetes mellitus without complications: Secondary | ICD-10-CM

## 2015-06-02 DIAGNOSIS — E785 Hyperlipidemia, unspecified: Secondary | ICD-10-CM | POA: Diagnosis not present

## 2015-06-02 LAB — CBC WITH DIFFERENTIAL/PLATELET
BASOS ABS: 0 10*3/uL (ref 0.0–0.1)
Basophils Relative: 0 % (ref 0–1)
EOS ABS: 0.2 10*3/uL (ref 0.0–0.7)
Eosinophils Relative: 2 % (ref 0–5)
HEMATOCRIT: 43.5 % (ref 36.0–46.0)
HEMOGLOBIN: 14.2 g/dL (ref 12.0–15.0)
LYMPHS ABS: 4.2 10*3/uL — AB (ref 0.7–4.0)
LYMPHS PCT: 44 % (ref 12–46)
MCH: 28.9 pg (ref 26.0–34.0)
MCHC: 32.6 g/dL (ref 30.0–36.0)
MCV: 88.6 fL (ref 78.0–100.0)
MONOS PCT: 7 % (ref 3–12)
MPV: 9.7 fL (ref 8.6–12.4)
Monocytes Absolute: 0.7 10*3/uL (ref 0.1–1.0)
NEUTROS ABS: 4.5 10*3/uL (ref 1.7–7.7)
NEUTROS PCT: 47 % (ref 43–77)
PLATELETS: 345 10*3/uL (ref 150–400)
RBC: 4.91 MIL/uL (ref 3.87–5.11)
RDW: 13.5 % (ref 11.5–15.5)
WBC: 9.5 10*3/uL (ref 4.0–10.5)

## 2015-06-02 NOTE — Patient Instructions (Signed)
Before you even begin to attack a weight-loss plan, it pays to remember this: You are not fat. You have fat. Losing weight isn't about blame or shame; it's simply another achievement to accomplish. Dieting is like any other skill-you have to buckle down and work at it. As long as you act in a smart, reasonable way, you'll ultimately get where you want to be. Here are some weight loss pearls for you.  1. It's Not a Diet. It's a Lifestyle Thinking of a diet as something you're on and suffering through only for the short term doesn't work. To shed weight and keep it off, you need to make permanent changes to the way you eat. It's OK to indulge occasionally, of course, but if you cut calories temporarily and then revert to your old way of eating, you'll gain back the weight quicker than you can say yo-yo. Use it to lose it. Research shows that one of the best predictors of long-term weight loss is how many pounds you drop in the first month. For that reason, nutritionists often suggest being stricter for the first two weeks of your new eating strategy to build momentum. Cut out added sugar and alcohol and avoid unrefined carbs. After that, figure out how you can reincorporate them in a way that's healthy and maintainable.  2. There's a Right Way to Exercise Working out burns calories and fat and boosts your metabolism by building muscle. But those trying to lose weight are notorious for overestimating the number of calories they burn and underestimating the amount they take in. Unfortunately, your system is biologically programmed to hold on to extra pounds and that means when you start exercising, your body senses the deficit and ramps up its hunger signals. If you're not diligent, you'll eat everything you burn and then some. Use it to lose it. Cardio gets all the exercise glory, but strength and interval training are the real heroes. They help you build lean muscle, which in turn increases your metabolism and  calorie-burning ability 3. Don't Overreact to Mild Hunger Some people have a hard time losing weight because of hunger anxiety. To them, being hungry is bad-something to be avoided at all costs-so they carry snacks with them and eat when they don't need to. Others eat because they're stressed out or bored. While you never want to get to the point of being ravenous (that's when bingeing is likely to happen), a hunger pang, a craving, or the fact that it's 3:00 p.m. should not send you racing for the vending machine or obsessing about the energy bar in your purse. Ideally, you should put off eating until your stomach is growling and it's difficult to concentrate.  Use it to lose it. When you feel the urge to eat, use the HALT method. Ask yourself, Am I really hungry? Or am I angry or anxious, lonely or bored, or tired? If you're still not certain, try the apple test. If you're truly hungry, an apple should seem delicious; if it doesn't, something else is going on. Or you can try drinking water and making yourself busy, if you are still hungry try a healthy snack.  4. Not All Calories Are Created Equal The mechanics of weight loss are pretty simple: Take in fewer calories than you use for energy. But the kind of food you eat makes all the difference. Processed food that's high in saturated fat and refined starch or sugar can cause inflammation that disrupts the hormone signals that tell   your brain you're full. The result: You eat a lot more.  Use it to lose it. Clean up your diet. Swap in whole, unprocessed foods, including vegetables, lean protein, and healthy fats that will fill you up and give you the biggest nutritional bang for your calorie buck. In a few weeks, as your brain starts receiving regular hunger and fullness signals once again, you'll notice that you feel less hungry overall and naturally start cutting back on the amount you eat.  5. Protein, Produce, and Plant-Based Fats Are Your Weight-Loss  [place] Here's why eating the three Ps regularly will help you drop pounds. Protein fills you up. You need it to build lean muscle, which keeps your metabolism humming so that you can torch more fat. People in a weight-loss program who ate double the recommended daily allowance for protein (about 110 grams for a 150-pound woman) lost 70 percent of their weight from fat, while people who ate the RDA lost only about 40 percent, one study found. Produce is packed with filling fiber. "It's very difficult to consume too many calories if you're eating a lot of vegetables. Example: Three cups of broccoli is a lot of food, yet only 93 calories. (Fruit is another story. It can be easy to overeat and can contain a lot of calories from sugar, so be sure to monitor your intake.) Plant-based fats like olive oil and those in avocados and nuts are healthy and extra satiating.  Use it to lose it. Aim to incorporate each of the three Ps into every meal and snack. People who eat protein throughout the day are able to keep weight off, according to a study in the American Journal of Clinical Nutrition. In addition to meat, poultry and seafood, good sources are beans, lentils, eggs, tofu, and yogurt. As for fat, keep portion sizes in check by measuring out salad dressing, oil, and nut butters (shoot for one to two tablespoons). Finally, eat veggies or a little fruit at every meal. People who did that consumed 308 fewer calories but didn't feel any hungrier than when they didn't eat more produce.  7. How You Eat Is As Important As What You Eat In order for your brain to register that you're full, you need to focus on what you're eating. Sit down whenever you eat, preferably at a table. Turn off the TV or computer, put down your phone, and look at your food. Smell it. Chew slowly, and don't put another bite on your fork until you swallow. When women ate lunch this attentively, they consumed 30 percent less when snacking later than  those who listened to an audiobook at lunchtime, according to a study in the British Journal of Nutrition. 8. Weighing Yourself Really Works The scale provides the best evidence about whether your efforts are paying off. Seeing the numbers tick up or down or stagnate is motivation to keep going-or to rethink your approach. A 2015 study at [employer] found that daily weigh-ins helped people lose more weight, keep it off, and maintain that loss, even after two years. Use it to lose it. Step on the scale at the same time every day for the best results. If your weight shoots up several pounds from one weigh-in to the next, don't freak out. Eating a lot of salt the night before or having your period is the likely culprit. The number should return to normal in a day or two. It's a steady climb that you need to do something about.   9. Too Much Stress and Too Little Sleep Are Your Enemies When you're tired and frazzled, your body cranks up the production of cortisol, the stress hormone that can cause carb cravings. Not getting enough sleep also boosts your levels of ghrelin, a hormone associated with hunger, while suppressing leptin, a hormone that signals fullness and satiety. People on a diet who slept only five and a half hours a night for two weeks lost 55 percent less fat and were hungrier than those who slept eight and a half hours, according to a study in the [employer] Association Journal. Use it to lose it. Prioritize sleep, aiming for seven hours or more a night, which research shows helps lower stress. And make sure you're getting quality zzz's. If a snoring spouse or a fidgety cat wakes you up frequently throughout the night, you may end up getting the equivalent of just four hours of sleep, according to a study from [employer]. Keep pets out of the bedroom, and use a white-noise app to drown out snoring. 10. You Will Hit a plateau-And You Can Bust Through It As you slim down, your  body releases much less leptin, the fullness hormone.  If you're not strength training, start right now. Building muscle can raise your metabolism to help you overcome a plateau. To keep your body challenged and burning calories, incorporate new moves and more intense intervals into your workouts or add another sweat session to your weekly routine. Alternatively, cut an extra 100 calories or so a day from your diet. Now that you've lost weight, your body simply doesn't need as much fuel.   Ways to cut 100 calories  1. Eat your eggs with hot sauce OR salsa instead of cheese.  Eggs are great for breakfast, but many people consider eggs and cheese to be BFFs. Instead of cheese-1 oz. of cheddar has 114 calories-top your eggs with hot sauce, which contains no calories and helps with satiety and metabolism. Salsa is also a great option!!  2. Top your toast, waffles or pancakes with mashed berries instead of jelly or syrup. Half a cup of berries-fresh, frozen or thawed-has about 40 calories, compared with 2 tbsp. of maple syrup or jelly, which both have about 100 calories. The berries will also give you a good punch of fiber, which helps keep you full and satisfied and won't spike blood sugar quickly like the jelly or syrup. 3. Swap the non-fat latte for black coffee with a splash of half-and-half. Contrary to its name, that non-fat latte has 130 calories and a startling 19g of carbohydrates per 16 oz. serving. Replacing that 'light' drinkable dessert with a black coffee with a splash of half-and-half saves you more than 100 calories per 16 oz. serving. 4. Sprinkle salads with freeze-dried raspberries instead of dried cranberries. If you want a sweet addition to your nutritious salad, stay away from dried cranberries. They have a whopping 130 calories per  cup and 30g carbohydrates. Instead, sprinkle freeze-dried raspberries guilt-free and save more than 100 calories per  cup serving, adding 3g of belly-filling  fiber. 5. Go for mustard in place of mayo on your sandwich. Mustard can add really nice flavor to any sandwich, and there are tons of varieties, from spicy to honey. A serving of mayo is 95 calories, versus 10 calories in a serving of mustard. 6. Choose a DIY salad dressing instead of the store-bought kind. Mix Dijon or whole grain mustard with low-fat Kefir or red wine vinegar   and garlic. 7. Use hummus as a spread instead of a dip. Use hummus as a spread on a high-fiber cracker or tortilla with a sandwich and save on calories without sacrificing taste. 8. Pick just one salad "accessory." Salad isn't automatically a calorie winner. It's easy to over-accessorize with toppings. Instead of topping your salad with nuts, avocado and cranberries (all three will clock in at 313 calories), just pick one. The next day, choose a different accessory, which will also keep your salad interesting. You don't wear all your jewelry every day, right? 9. Ditch the white pasta in favor of spaghetti squash. One cup of cooked spaghetti squash has about 40 calories, compared with traditional spaghetti, which comes with more than 200. Spaghetti squash is also nutrient-dense. It's a good source of fiber and Vitamins A and C, and it can be eaten just like you would eat pasta-with a great tomato sauce and turkey meatballs or with pesto, tofu and spinach, for example. 10. Dress up your chili, soups and stews with non-fat Greek yogurt instead of sour cream. Just a 'dollop' of sour cream can set you back 115 calories and a whopping 12g of fat-seven of which are of the artery-clogging variety. Added bonus: Greek yogurt is packed with muscle-building protein, calcium and B Vitamins. 11. Mash cauliflower instead of mashed potatoes. One cup of traditional mashed potatoes-in all their creamy goodness-has more than 200 calories, compared to mashed cauliflower, which you can typically eat for less than 100 calories per 1 cup serving.  Cauliflower is a great source of the antioxidant indole-3-carbinol (I3C), which may help reduce the risk of some cancers, like breast cancer. 12. Ditch the ice cream sundae in favor of a Greek yogurt parfait. Instead of a cup of ice cream or fro-yo for dessert, try 1 cup of nonfat Greek yogurt topped with fresh berries and a sprinkle of cacao nibs. Both toppings are packed with antioxidants, which can help reduce cellular inflammation and oxidative damage. And the comparison is a no-brainer: One cup of ice cream has about 275 calories; one cup of frozen yogurt has about 230; and a cup of Greek yogurt has just 130, plus twice the protein, so you're less likely to return to the freezer for a second helping. 13. Put olive oil in a spray container instead of using it directly from the bottle. Each tablespoon of olive oil is 120 calories and 15g of fat. Use a mister instead of pouring it straight into the pan or onto a salad. This allows for portion control and will save you more than 100 calories. 14. When baking, substitute canned pumpkin for butter or oil. Canned pumpkin-not pumpkin pie mix-is loaded with Vitamin A, which is important for skin and eye health, as well as immunity. And the comparisons are pretty crazy:  cup of canned pumpkin has about 40 calories, compared to butter or oil, which has more than 800 calories. Yes, 800 calories. Applesauce and mashed banana can also serve as good substitutions for butter or oil, usually in a 1:1 ratio. 15. Top casseroles with high-fiber cereal instead of breadcrumbs. Breadcrumbs are typically made with white bread, while breakfast cereals contain 5-9g of fiber per serving. Not only will you save more than 150 calories per  cup serving, the swap will also keep you more full and you'll get a metabolism boost from the added fiber. 16. Snack on pistachios instead of macadamia nuts. Believe it or not, you get the same amount of calories from 35   pistachios (100  calories) as you would from only five macadamia nuts. 17. Chow down on kale chips rather than potato chips. This is my favorite 'don't knock it 'till you try it' swap. Kale chips are so easy to make at home, and you can spice them up with a little grated parmesan or chili powder. Plus, they're a mere fraction of the calories of potato chips, but with the same crunch factor we crave so often. 18. Add seltzer and some fruit slices to your cocktail instead of soda or fruit juice. One cup of soda or fruit juice can pack on as much as 140 calories. Instead, use seltzer and fruit slices. The fruit provides valuable phytochemicals, such as flavonoids and anthocyanins, which help to combat cancer and stave off the aging process.  

## 2015-06-02 NOTE — Progress Notes (Signed)
Assessment and Plan:  Hypertension: Continue medication, monitor blood pressure at home. Continue DASH diet.  Reminder to go to the ER if any CP, SOB, nausea, dizziness, severe HA, changes vision/speech, left arm numbness and tingling and jaw pain. Cholesterol: Continue diet and exercise. Check cholesterol.  Diabetes-Continue diet and exercise. Check A1C Vitamin D Def- check level and continue medications.  Obesity with co morbidities- long discussion about weight loss, diet, and exercise Hypothyroidism-check TSH level  Continue diet and meds as discussed. Further disposition pending results of labs. Discussed med's effects and SE's.   HPI 55 y.o. female  presents for 3 month follow up with hypertension, hyperlipidemia, diabetes and vitamin D. Her blood pressure has been controlled at home, today their BP is BP: 130/80 mmHg She does workout. She denies chest pain, shortness of breath, dizziness.  She is on cholesterol medication and denies myalgias. Her cholesterol is at goal. The cholesterol was:  03/02/2015: Cholesterol 248*; HDL 32*; LDL Cholesterol NOT CALC; Triglycerides 503* She has been working on diet and exercise for Diabetes, not on medication at this time, and denies paresthesia of the feet, polydipsia, polyuria and visual disturbances  Lab Results  Component Value Date   HGBA1C 7.4* 03/02/2015   Lab Results  Component Value Date   GFRNONAA >89 03/02/2015  Patient is on Vitamin D supplement. 03/02/2015: Vit D, 25-Hydroxy 29*  She is on thyroid medication at this time, 2nd week of Jan.  Lab Results  Component Value Date   TSH 2.704 03/02/2015  BMI is Body mass index is 42.4 kg/(m^2)., she is working on diet and exercise. Has been doing nutrisystem and has lost 11 lbs, drinking tons of water and walking.  Wt Readings from Last 3 Encounters:  06/02/15 243 lb 3.2 oz (110.315 kg)  03/02/15 254 lb (115.214 kg)  09/16/14 250 lb (113.399 kg)     Current Medications:  Current  Outpatient Prescriptions on File Prior to Visit  Medication Sig Dispense Refill  . aspirin EC 81 MG tablet Take 1 tablet (81 mg total) by mouth daily. 150 tablet 2  . cetirizine (ZYRTEC) 10 MG tablet Take 10 mg by mouth daily.    Marland Kitchen escitalopram (LEXAPRO) 20 MG tablet take 1/2 tablet by mouth once daily 30 tablet 5  . hydrochlorothiazide (HYDRODIURIL) 25 MG tablet take 1 tablet by mouth once daily 30 tablet 3  . levothyroxine (SYNTHROID, LEVOTHROID) 100 MCG tablet take 1/2 tablet by mouth daily 30 TO 60 MINUTES BEFORE FOOD as directed by prescriber 30 tablet 3  . Multiple Vitamins-Minerals (MULTIVITAMIN PO) Take by mouth daily.     No current facility-administered medications on file prior to visit.   Medical History:  Past Medical History  Diagnosis Date  . Hypertension   . Allergy   . Anxiety   . Hyperlipidemia   . Prediabetes   . History of nephrolithiasis    Allergies:  Allergies  Allergen Reactions  . Codeine Nausea Only  . Penicillins     Review of Systems  Constitutional: Negative.   HENT: Negative.   Eyes: Negative.   Respiratory: Negative.   Cardiovascular: Negative.   Gastrointestinal: Negative.   Genitourinary: Negative.   Musculoskeletal: Negative.   Skin: Negative.   Neurological: Negative.   Endo/Heme/Allergies: Negative.   Psychiatric/Behavioral: Negative.      Family history- Review and unchanged Social history- Review and unchanged Physical Exam: BP 130/80 mmHg  Pulse 71  Temp(Src) 97.5 F (36.4 C) (Temporal)  Resp 16  Ht 5'  3.5" (1.613 m)  Wt 243 lb 3.2 oz (110.315 kg)  BMI 42.40 kg/m2  SpO2 95% Wt Readings from Last 3 Encounters:  06/02/15 243 lb 3.2 oz (110.315 kg)  03/02/15 254 lb (115.214 kg)  09/16/14 250 lb (113.399 kg)   General Appearance: Well nourished, in no apparent distress. Eyes: PERRLA, EOMs, conjunctiva no swelling or erythema Sinuses: No Frontal/maxillary tenderness ENT/Mouth: Ext aud canals clear, TMs without erythema,  bulging. No erythema, swelling, or exudate on post pharynx.  Tonsils not swollen or erythematous. Hearing normal.  Neck: Supple, thyroid normal.  Respiratory: Respiratory effort normal, BS equal bilaterally without rales, rhonchi, wheezing or stridor.  Cardio: RRR with no MRGs. Brisk peripheral pulses without edema.  Abdomen: Soft, + BS.  Non tender, no guarding, rebound, hernias, masses. Lymphatics: Non tender without lymphadenopathy.  Musculoskeletal: Full ROM, 5/5 strength, normal gait.  Skin: Warm, dry without rashes, lesions, ecchymosis.  Neuro: Cranial nerves intact. No cerebellar symptoms. Sensation intact.  Psych: Awake and oriented X 3, normal affect, Insight and Judgment appropriate.    Quentin Mulling, PA-C 4:21 PM [facility]

## 2015-06-03 LAB — MAGNESIUM: Magnesium: 1.8 mg/dL (ref 1.5–2.5)

## 2015-06-03 LAB — BASIC METABOLIC PANEL WITH GFR
BUN: 13 mg/dL (ref 7–25)
CHLORIDE: 99 mmol/L (ref 98–110)
CO2: 25 mmol/L (ref 20–31)
Calcium: 9.2 mg/dL (ref 8.6–10.4)
Creat: 0.55 mg/dL (ref 0.50–1.05)
GFR, Est African American: >89 mL/min (ref >=60)
GFR, Est Non African American: >89 mL/min (ref >=60)
Glucose, Bld: 97 mg/dL (ref 65–99)
POTASSIUM: 3.5 mmol/L (ref 3.5–5.3)
SODIUM: 138 mmol/L (ref 135–146)

## 2015-06-03 LAB — TSH: TSH: 1.23 mIU/L

## 2015-06-03 LAB — LIPID PANEL
CHOL/HDL RATIO: 6.8 ratio — AB (ref <=5.0)
Cholesterol: 224 mg/dL — ABNORMAL HIGH (ref 125–200)
HDL: 33 mg/dL — AB (ref >=46)
Triglycerides: 524 mg/dL — ABNORMAL HIGH (ref <150)

## 2015-06-03 LAB — HEPATIC FUNCTION PANEL
ALK PHOS: 70 U/L (ref 33–130)
ALT: 41 U/L — ABNORMAL HIGH (ref 6–29)
AST: 20 U/L (ref 10–35)
Albumin: 4.1 g/dL (ref 3.6–5.1)
BILIRUBIN DIRECT: 0.1 mg/dL (ref <=0.2)
BILIRUBIN INDIRECT: 0.2 mg/dL (ref 0.2–1.2)
BILIRUBIN TOTAL: 0.3 mg/dL (ref 0.2–1.2)
Total Protein: 7 g/dL (ref 6.1–8.1)

## 2015-06-03 LAB — HEMOGLOBIN A1C
HEMOGLOBIN A1C: 7.1 % — AB (ref <5.7)
Mean Plasma Glucose: 157 mg/dL — ABNORMAL HIGH (ref <117)

## 2015-06-03 LAB — VITAMIN D 25 HYDROXY (VIT D DEFICIENCY, FRACTURES): Vit D, 25-Hydroxy: 49 ng/mL (ref 30–100)

## 2015-09-05 ENCOUNTER — Other Ambulatory Visit: Payer: Self-pay | Admitting: Physician Assistant

## 2015-09-09 ENCOUNTER — Encounter: Payer: Self-pay | Admitting: Physician Assistant

## 2015-09-21 ENCOUNTER — Encounter: Payer: Self-pay | Admitting: Physician Assistant

## 2015-10-12 ENCOUNTER — Other Ambulatory Visit: Payer: Self-pay | Admitting: Physician Assistant

## 2015-11-07 ENCOUNTER — Ambulatory Visit (INDEPENDENT_AMBULATORY_CARE_PROVIDER_SITE_OTHER): Payer: 59 | Admitting: Physician Assistant

## 2015-11-07 VITALS — BP 130/86 | HR 74 | Temp 98.0°F | Ht 64.0 in | Wt 248.0 lb

## 2015-11-07 DIAGNOSIS — I10 Essential (primary) hypertension: Secondary | ICD-10-CM | POA: Diagnosis not present

## 2015-11-07 DIAGNOSIS — E119 Type 2 diabetes mellitus without complications: Secondary | ICD-10-CM | POA: Diagnosis not present

## 2015-11-07 DIAGNOSIS — Z79899 Other long term (current) drug therapy: Secondary | ICD-10-CM | POA: Diagnosis not present

## 2015-11-07 DIAGNOSIS — E559 Vitamin D deficiency, unspecified: Secondary | ICD-10-CM | POA: Diagnosis not present

## 2015-11-07 DIAGNOSIS — E785 Hyperlipidemia, unspecified: Secondary | ICD-10-CM

## 2015-11-07 LAB — BASIC METABOLIC PANEL WITH GFR
BUN: 13 mg/dL (ref 7–25)
CHLORIDE: 102 mmol/L (ref 98–110)
CO2: 25 mmol/L (ref 20–31)
Calcium: 9.3 mg/dL (ref 8.6–10.4)
Creat: 0.55 mg/dL (ref 0.50–1.05)
GFR, Est African American: >89 mL/min (ref >=60)
GFR, Est Non African American: >89 mL/min (ref >=60)
Glucose, Bld: 158 mg/dL — ABNORMAL HIGH (ref 65–99)
POTASSIUM: 4 mmol/L (ref 3.5–5.3)
SODIUM: 139 mmol/L (ref 135–146)

## 2015-11-07 LAB — CBC WITH DIFFERENTIAL/PLATELET
BASOS PCT: 0 %
Basophils Absolute: 0 cells/uL (ref 0–200)
EOS ABS: 160 {cells}/uL (ref 15–500)
Eosinophils Relative: 2 %
HCT: 44.7 % (ref 35.0–45.0)
Hemoglobin: 14.7 g/dL (ref 11.7–15.5)
Lymphocytes Relative: 42 %
Lymphs Abs: 3360 cells/uL (ref 850–3900)
MCH: 29.2 pg (ref 27.0–33.0)
MCHC: 32.9 g/dL (ref 32.0–36.0)
MCV: 88.7 fL (ref 80.0–100.0)
MONOS PCT: 6 %
MPV: 9.5 fL (ref 7.5–12.5)
Monocytes Absolute: 480 cells/uL (ref 200–950)
NEUTROS ABS: 4000 {cells}/uL (ref 1500–7800)
Neutrophils Relative %: 50 %
PLATELETS: 307 10*3/uL (ref 140–400)
RBC: 5.04 MIL/uL (ref 3.80–5.10)
RDW: 13.6 % (ref 11.0–15.0)
WBC: 8 10*3/uL (ref 3.8–10.8)

## 2015-11-07 LAB — LIPID PANEL
CHOL/HDL RATIO: 5.8 ratio — AB (ref <=5.0)
Cholesterol: 239 mg/dL — ABNORMAL HIGH (ref 125–200)
HDL: 41 mg/dL — AB (ref >=46)
LDL Cholesterol: 146 mg/dL — ABNORMAL HIGH (ref <130)
Triglycerides: 259 mg/dL — ABNORMAL HIGH (ref <150)
VLDL: 52 mg/dL — ABNORMAL HIGH (ref <30)

## 2015-11-07 LAB — TSH: TSH: 2.21 mIU/L

## 2015-11-07 LAB — HEPATIC FUNCTION PANEL
ALK PHOS: 76 U/L (ref 33–130)
ALT: 19 U/L (ref 6–29)
AST: 16 U/L (ref 10–35)
Albumin: 4.3 g/dL (ref 3.6–5.1)
BILIRUBIN DIRECT: 0 mg/dL (ref <=0.2)
BILIRUBIN INDIRECT: 0.4 mg/dL (ref 0.2–1.2)
BILIRUBIN TOTAL: 0.4 mg/dL (ref 0.2–1.2)
Total Protein: 6.9 g/dL (ref 6.1–8.1)

## 2015-11-07 LAB — MAGNESIUM: MAGNESIUM: 1.9 mg/dL (ref 1.5–2.5)

## 2015-11-07 NOTE — Progress Notes (Signed)
Assessment and Plan:  Hypertension: Continue medication, monitor blood pressure at home. Continue DASH diet.  Reminder to go to the ER if any CP, SOB, nausea, dizziness, severe HA, changes vision/speech, left arm numbness and tingling and jaw pain. Cholesterol: Continue diet and exercise. Check cholesterol.  Diabetes without complications-Continue diet and exercise, would like to try diet rather than medication at this time. Check A1C Vitamin D Def- check level and continue medications.  Morbid Obesity with co morbidities- long discussion about weight loss, diet, and exercise Hypothyroidism-check TSH level  Continue diet and meds as discussed. Further disposition pending results of labs. Discussed med's effects and SE's.  No future appointments.   HPI 55 y.o. female  presents for 3 month follow up with hypertension, hyperlipidemia, diabetes and vitamin D. Her blood pressure has been controlled at home, today their BP is BP: 130/86 She does workout. She denies chest pain, shortness of breath, dizziness.  She is on cholesterol medication and denies myalgias. Her cholesterol is at goal. The cholesterol was:  06/02/2015: Cholesterol 224; HDL 33; LDL Cholesterol NOT CALC; Triglycerides 524 She has been working on diet and exercise for Diabetes without complications, not on medication at this time but wants to start metformin again in Sept, she is on bASA, does not check sugars at  Home, and denies paresthesia of the feet, polydipsia, polyuria and visual disturbances  Lab Results  Component Value Date   HGBA1C 7.1 (H) 06/02/2015   Lab Results  Component Value Date   GFRNONAA >89 06/02/2015  Patient is on Vitamin D supplement. 06/02/2015: Vit D, 25-Hydroxy 49  She is doing well on the lexapro.  She is on thyroid medication at this time.  Lab Results  Component Value Date   TSH 1.23 06/02/2015  BMI is Body mass index is 42.57 kg/m., she is working on diet and exercise.  Wt Readings from Last 3  Encounters:  11/07/15 248 lb (112.5 kg)  06/02/15 243 lb 3.2 oz (110.3 kg)  03/02/15 254 lb (115.2 kg)     Current Medications:  Current Outpatient Prescriptions on File Prior to Visit  Medication Sig Dispense Refill  . aspirin EC 81 MG tablet Take 1 tablet (81 mg total) by mouth daily. 150 tablet 2  . cetirizine (ZYRTEC) 10 MG tablet Take 10 mg by mouth daily.    Marland Kitchen escitalopram (LEXAPRO) 20 MG tablet take 1/2 tablet by mouth once daily 30 tablet 0  . hydrochlorothiazide (HYDRODIURIL) 25 MG tablet take 1 tablet by mouth once daily 30 tablet 3  . levothyroxine (SYNTHROID, LEVOTHROID) 100 MCG tablet take 1/2 tablet by mouth daily 30 TO 60 MINUTES BEFORE FOOD as directed by prescriber 30 tablet 3  . Multiple Vitamins-Minerals (MULTIVITAMIN PO) Take by mouth daily.     No current facility-administered medications on file prior to visit.    Medical History:  Past Medical History:  Diagnosis Date  . Allergy   . Anxiety   . History of nephrolithiasis   . Hyperlipidemia   . Hypertension   . Prediabetes    Allergies:  Allergies  Allergen Reactions  . Codeine Nausea Only  . Penicillins     Review of Systems  Constitutional: Negative.   HENT: Negative.   Eyes: Negative.   Respiratory: Negative.   Cardiovascular: Negative.   Gastrointestinal: Negative.   Genitourinary: Negative.   Musculoskeletal: Negative.   Skin: Negative.   Neurological: Negative.   Endo/Heme/Allergies: Negative.   Psychiatric/Behavioral: Negative.      Family history-  Review and unchanged Social history- Review and unchanged Physical Exam: BP 130/86   Pulse 74   Temp 98 F (36.7 C) (Temporal)   Ht 5\' 4"  (1.626 m)   Wt 248 lb (112.5 kg)   BMI 42.57 kg/m  Wt Readings from Last 3 Encounters:  11/07/15 248 lb (112.5 kg)  06/02/15 243 lb 3.2 oz (110.3 kg)  03/02/15 254 lb (115.2 kg)   General Appearance: Well nourished, in no apparent distress. Eyes: PERRLA, EOMs, conjunctiva no swelling or  erythema Sinuses: No Frontal/maxillary tenderness ENT/Mouth: Ext aud canals clear, TMs without erythema, bulging. No erythema, swelling, or exudate on post pharynx.  Tonsils not swollen or erythematous. Hearing normal.  Neck: Supple, thyroid normal.  Respiratory: Respiratory effort normal, BS equal bilaterally without rales, rhonchi, wheezing or stridor.  Cardio: RRR with no MRGs. Brisk peripheral pulses without edema.  Abdomen: Soft, + BS.  Non tender, no guarding, rebound, hernias, masses. Lymphatics: Non tender without lymphadenopathy.  Musculoskeletal: Full ROM, 5/5 strength, normal gait.  Skin: Warm, dry without rashes, lesions, ecchymosis.  Neuro: Cranial nerves intact. No cerebellar symptoms. Sensation intact.  Psych: Awake and oriented X 3, normal affect, Insight and Judgment appropriate.    Quentin MullingAmanda Desarea Ohagan, PA-C 11:04 AM [facility]

## 2015-11-07 NOTE — Patient Instructions (Signed)
We want weight loss that will last so you should lose 1-2 pounds a week.  THAT IS IT! Please pick THREE things a month to change. Once it is a habit check off the item. Then pick another three items off the list to become habits.  If you are already doing a habit on the list GREAT!  Cross that item off! o Don't drink your calories. Ie, alcohol, soda, fruit juice, and sweet tea.  o Drink more water. Drink a glass when you feel hungry or before each meal.  o Eat breakfast - Complex carb and protein (likeDannon light and fit yogurt, oatmeal, fruit, eggs, turkey bacon). o Measure your cereal.  Eat no more than one cup a day. (ie Kashi) o Eat an apple a day. o Add a vegetable a day. o Try a new vegetable a month. o Use Pam! Stop using oil or butter to cook. o Don't finish your plate or use smaller plates. o Share your dessert. o Eat sugar free Jello for dessert or frozen grapes. o Don't eat 2-3 hours before bed. o Switch to whole wheat bread, pasta, and brown rice. o Make healthier choices when you eat out. No fries! o Pick baked chicken, NOT fried. o Don't forget to SLOW DOWN when you eat. It is not going anywhere.  o Take the stairs. o Park far away in the parking lot o Lift soup cans (or weights) for 10 minutes while watching TV. o Walk at work for 10 minutes during break. o Walk outside 1 time a week with your friend, kids, dog, or significant other. o Start a walking group at church. o Walk the mall as much as you can tolerate.  o Keep a food diary. o Weigh yourself daily. o Walk for 15 minutes 3 days per week. o Cook at home more often and eat out less.  If life happens and you go back to old habits, it is okay.  Just start over. You can do it!   If you experience chest pain, get short of breath, or tired during the exercise, please stop immediately and inform your doctor.      Bad carbs also include fruit juice, alcohol, and sweet tea. These are empty calories that do not signal  to your brain that you are full.   Please remember the good carbs are still carbs which convert into sugar. So please measure them out no more than 1/2-1 cup of rice, oatmeal, pasta, and beans  Veggies are however free foods! Pile them on.   Not all fruit is created equal. Please see the list below, the fruit at the bottom is higher in sugars than the fruit at the top. Please avoid all dried fruits.     

## 2015-11-08 LAB — HEMOGLOBIN A1C
Hgb A1c MFr Bld: 7.2 % — ABNORMAL HIGH (ref <5.7)
MEAN PLASMA GLUCOSE: 160 mg/dL

## 2015-12-14 ENCOUNTER — Other Ambulatory Visit: Payer: Self-pay | Admitting: Physician Assistant

## 2016-01-02 ENCOUNTER — Other Ambulatory Visit: Payer: Self-pay | Admitting: Internal Medicine

## 2016-01-02 ENCOUNTER — Other Ambulatory Visit: Payer: Self-pay | Admitting: Physician Assistant

## 2016-01-29 ENCOUNTER — Other Ambulatory Visit: Payer: Self-pay | Admitting: Physician Assistant

## 2016-02-27 ENCOUNTER — Encounter: Payer: Self-pay | Admitting: Physician Assistant

## 2016-03-03 ENCOUNTER — Encounter: Payer: Self-pay | Admitting: *Deleted

## 2016-03-13 ENCOUNTER — Encounter: Payer: Self-pay | Admitting: Physician Assistant

## 2016-04-09 ENCOUNTER — Other Ambulatory Visit: Payer: Self-pay | Admitting: Internal Medicine

## 2016-04-18 ENCOUNTER — Encounter: Payer: Self-pay | Admitting: Physician Assistant

## 2016-06-10 ENCOUNTER — Other Ambulatory Visit: Payer: Self-pay | Admitting: Internal Medicine

## 2016-07-12 ENCOUNTER — Other Ambulatory Visit: Payer: Self-pay | Admitting: Physician Assistant

## 2016-07-12 ENCOUNTER — Other Ambulatory Visit: Payer: Self-pay | Admitting: Internal Medicine

## 2016-07-13 ENCOUNTER — Ambulatory Visit (INDEPENDENT_AMBULATORY_CARE_PROVIDER_SITE_OTHER): Payer: 59 | Admitting: Physician Assistant

## 2016-07-13 ENCOUNTER — Encounter: Payer: Self-pay | Admitting: Physician Assistant

## 2016-07-13 VITALS — BP 128/84 | HR 80 | Temp 97.7°F | Resp 16 | Ht 64.0 in | Wt 253.0 lb

## 2016-07-13 DIAGNOSIS — Z0001 Encounter for general adult medical examination with abnormal findings: Secondary | ICD-10-CM

## 2016-07-13 DIAGNOSIS — Z136 Encounter for screening for cardiovascular disorders: Secondary | ICD-10-CM | POA: Diagnosis not present

## 2016-07-13 DIAGNOSIS — T7840XD Allergy, unspecified, subsequent encounter: Secondary | ICD-10-CM

## 2016-07-13 DIAGNOSIS — E785 Hyperlipidemia, unspecified: Secondary | ICD-10-CM

## 2016-07-13 DIAGNOSIS — Z79899 Other long term (current) drug therapy: Secondary | ICD-10-CM

## 2016-07-13 DIAGNOSIS — I10 Essential (primary) hypertension: Secondary | ICD-10-CM | POA: Diagnosis not present

## 2016-07-13 DIAGNOSIS — E559 Vitamin D deficiency, unspecified: Secondary | ICD-10-CM | POA: Diagnosis not present

## 2016-07-13 DIAGNOSIS — R6889 Other general symptoms and signs: Secondary | ICD-10-CM

## 2016-07-13 DIAGNOSIS — Z87442 Personal history of urinary calculi: Secondary | ICD-10-CM

## 2016-07-13 DIAGNOSIS — D649 Anemia, unspecified: Secondary | ICD-10-CM

## 2016-07-13 DIAGNOSIS — F419 Anxiety disorder, unspecified: Secondary | ICD-10-CM

## 2016-07-13 DIAGNOSIS — E119 Type 2 diabetes mellitus without complications: Secondary | ICD-10-CM

## 2016-07-13 DIAGNOSIS — Z0000 Encounter for general adult medical examination without abnormal findings: Secondary | ICD-10-CM

## 2016-07-13 LAB — HEPATIC FUNCTION PANEL
ALT: 28 U/L (ref 6–29)
AST: 19 U/L (ref 10–35)
Albumin: 4.3 g/dL (ref 3.6–5.1)
Alkaline Phosphatase: 90 U/L (ref 33–130)
BILIRUBIN DIRECT: 0.1 mg/dL (ref <=0.2)
BILIRUBIN INDIRECT: 0.3 mg/dL (ref 0.2–1.2)
BILIRUBIN TOTAL: 0.4 mg/dL (ref 0.2–1.2)
Total Protein: 7.3 g/dL (ref 6.1–8.1)

## 2016-07-13 LAB — LIPID PANEL
CHOL/HDL RATIO: 4.9 ratio (ref <5.0)
CHOLESTEROL: 209 mg/dL — AB (ref <200)
HDL: 43 mg/dL — AB (ref >50)
LDL Cholesterol: 128 mg/dL — ABNORMAL HIGH (ref <100)
TRIGLYCERIDES: 189 mg/dL — AB (ref <150)
VLDL: 38 mg/dL — AB (ref <30)

## 2016-07-13 LAB — CBC WITH DIFFERENTIAL/PLATELET
BASOS PCT: 1 %
Basophils Absolute: 75 cells/uL (ref 0–200)
EOS PCT: 2 %
Eosinophils Absolute: 150 cells/uL (ref 15–500)
HCT: 44 % (ref 35.0–45.0)
Hemoglobin: 14.6 g/dL (ref 11.7–15.5)
Lymphocytes Relative: 41 %
Lymphs Abs: 3075 cells/uL (ref 850–3900)
MCH: 29.9 pg (ref 27.0–33.0)
MCHC: 33.2 g/dL (ref 32.0–36.0)
MCV: 90 fL (ref 80.0–100.0)
MONO ABS: 525 {cells}/uL (ref 200–950)
MONOS PCT: 7 %
MPV: 9.6 fL (ref 7.5–12.5)
NEUTROS ABS: 3675 {cells}/uL (ref 1500–7800)
Neutrophils Relative %: 49 %
PLATELETS: 300 10*3/uL (ref 140–400)
RBC: 4.89 MIL/uL (ref 3.80–5.10)
RDW: 13.3 % (ref 11.0–15.0)
WBC: 7.5 10*3/uL (ref 3.8–10.8)

## 2016-07-13 LAB — BASIC METABOLIC PANEL WITH GFR
BUN: 14 mg/dL (ref 7–25)
CO2: 23 mmol/L (ref 20–31)
Calcium: 9.6 mg/dL (ref 8.6–10.4)
Chloride: 102 mmol/L (ref 98–110)
Creat: 0.63 mg/dL (ref 0.50–1.05)
Glucose, Bld: 190 mg/dL — ABNORMAL HIGH (ref 65–99)
Potassium: 3.9 mmol/L (ref 3.5–5.3)
SODIUM: 140 mmol/L (ref 135–146)

## 2016-07-13 LAB — IRON AND TIBC
%SAT: 20 % (ref 11–50)
Iron: 77 ug/dL (ref 45–160)
TIBC: 392 ug/dL (ref 250–450)
UIBC: 315 ug/dL (ref 125–400)

## 2016-07-13 MED ORDER — PHENTERMINE HCL 37.5 MG PO TABS
37.5000 mg | ORAL_TABLET | Freq: Every day | ORAL | 2 refills | Status: DC
Start: 2016-07-13 — End: 2018-06-13

## 2016-07-13 NOTE — Progress Notes (Signed)
Complete Physical  Assessment and Plan:   Routine general medical examination at a health care facility Schedule colonoscopy and schedule eye exam  Essential hypertension - continue medications, DASH diet, exercise and monitor at home. Call if greater than 130/80.  -     CBC with Differential/Platelet -     BASIC METABOLIC PANEL WITH GFR -     Hepatic function panel -     TSH -     Urinalysis, Routine w reflex microscopic -     Microalbumin / creatinine urine ratio -     EKG 12-Lead  Diabetes mellitus without complication (HCC) Discussed general issues about diabetes pathophysiology and management., Educational material distributed., Suggested low cholesterol diet., Encouraged aerobic exercise., Discussed foot care., Reminded to get yearly retinal exam. -     Hemoglobin A1c  Hyperlipidemia, unspecified hyperlipidemia type -continue medications, check lipids, decrease fatty foods, increase activity.  -     Lipid panel  Severe obesity (BMI >= 40) (HCC) -     phentermine (ADIPEX-P) 37.5 MG tablet; Take 1 tablet (37.5 mg total) by mouth daily before breakfast. Will do phentermine, follow up 1 month  - long discussion about weight loss, diet, and exercise  Vitamin D deficiency -     VITAMIN D 25 Hydroxy (Vit-D Deficiency, Fractures)  Medication management -     Magnesium  History of nephrolithiasis Increase water  Anxiety Increase lexapro to  daily  Allergic state, subsequent encounter  Anemia, unspecified type -     Iron and TIBC -     Ferritin   Continue diet and meds as discussed. Further disposition pending results of labs. Discussed med's effects and SE's.  Future Appointments Date Time Provider Department Center  07/26/2017 9:30 AM Quentin Mulling, PA-C [facility] None     HPI 56 y.o. female  presents for CPE and 3 month follow up with hypertension, hyperlipidemia, diabetes and vitamin D. Her blood pressure has been controlled at home, today their BP is  BP: 128/84 She does workout. She denies chest pain, shortness of breath, dizziness.  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  She is on cholesterol medication and denies myalgias. Her cholesterol is at goal. The cholesterol was:  11/07/2015: Cholesterol 239; HDL 41; LDL Cholesterol 146; Triglycerides 259 She has been working on diet and exercise for Diabetes without complications, not on medication at this time but wants to start metformin again in Sept, she is on bASA, does not check sugars at  Home, and denies paresthesia of the feet, polydipsia, polyuria and visual disturbances  Lab Results  Component Value Date   HGBA1C 7.2 (H) 11/07/2015   Lab Results  Component Value Date   GFRNONAA >89 11/07/2015  Patient is on Vitamin D supplement. She is doing well on the lexapro.  She is on thyroid medication at this time.  Lab Results  Component Value Date   TSH 2.21 11/07/2015  BMI is Body mass index is 43.43 kg/m., she is working on diet and exercise.  Wt Readings from Last 3 Encounters:  07/13/16 253 lb (114.8 kg)  11/07/15 248 lb (112.5 kg)  06/02/15 243 lb 3.2 oz (110.3 kg)     Current Medications:  Current Outpatient Prescriptions on File Prior to Visit  Medication Sig Dispense Refill  . cetirizine (ZYRTEC) 10 MG tablet Take 10 mg by mouth daily.    Marland Kitchen escitalopram (LEXAPRO) 20 MG tablet take 1/2 tablet  by mouth once daily  15 tablet 0  . hydrochlorothiazide (HYDRODIURIL) 25 MG tablet take 1 tablet by mouth once daily 90 tablet 0  . Multiple Vitamins-Minerals (MULTIVITAMIN PO) Take by mouth daily.     No current facility-administered medications on file prior to visit.    Medical History:  Past Medical History:  Diagnosis Date  . Allergy   . Anxiety   . History of nephrolithiasis   . Hyperlipidemia   . Hypertension   . Prediabetes    Preventative Medicine Immunization History  Administered Date(s)  Administered  . Influenza Split 12/01/2013  . Influenza, Seasonal, Injecte, Preservative Fre 03/02/2015  . Pneumococcal Polysaccharide-23 06/09/2013  . Tdap 08/11/2007   Tetanus: 2009 due next year Pneumovax: 2015 Prevnar 13: due at age 55 Flu vaccine: 2016 did not get this year Zostavax: N/A  Pap: 2016, never abnormal pap, will get every 5 years 3DMGM: 02/2016 DEXA: N/A Colonoscopy: will schedule EGD: N/A  Allergies Allergies  Allergen Reactions  . Codeine Nausea Only  . Penicillins     SURGICAL HISTORY She  has a past surgical history that includes Tonsillectomy and adenoidectomy and Cesarean section (1995). FAMILY HISTORY Her family history includes Cancer in her father; Hypertension in her father and mother. SOCIAL HISTORY She  reports that she has quit smoking. She has never used smokeless tobacco. She reports that she does not drink alcohol or use drugs.  Review of Systems  Constitutional: Negative.   HENT: Negative.   Eyes: Negative.   Respiratory: Negative.   Cardiovascular: Negative.   Gastrointestinal: Negative.   Genitourinary: Negative.   Musculoskeletal: Negative.   Skin: Negative.   Neurological: Negative.   Endo/Heme/Allergies: Negative.   Psychiatric/Behavioral: Negative.     Physical Exam: BP 128/84   Pulse 80   Temp 97.7 F (36.5 C)   Resp 16   Ht  (1.626 m)   Wt 253 lb (114.8 kg)   SpO2 97%   BMI 43.43 kg/m  Wt Readings from Last 3 Encounters:  07/13/16 253 lb (114.8 kg)  11/07/15 248 lb (112.5 kg)  06/02/15 243 lb 3.2 oz (110.3 kg)   General Appearance: Well nourished, in no apparent distress. Eyes: PERRLA, EOMs, conjunctiva no swelling or erythema Sinuses: No Frontal/maxillary tenderness ENT/Mouth: Ext aud canals clear, TMs without erythema, bulging. No erythema, swelling, or exudate on post pharynx.  Tonsils not swollen or erythematous. Hearing normal.  Neck: Supple, thyroid normal.  Respiratory: Respiratory effort normal,  BS equal bilaterally without rales, rhonchi, wheezing or stridor.  Cardio: RRR with no MRGs. Brisk peripheral pulses without edema.  Abdomen: Soft, + BS.  Non tender, no guarding, rebound, hernias, masses. Lymphatics: Non tender without lymphadenopathy.  Musculoskeletal: Full ROM, 5/5 strength, normal gait.  Skin: Warm, dry without rashes, lesions, ecchymosis.  Neuro: Cranial nerves intact. No cerebellar symptoms. Sensation intact.  Psych: Awake and oriented X 3, normal affect, Insight and Judgment appropriate.   EKG WNL, no ST changes  Quentin Mulling, PA-C 9:48 AM [facility]

## 2016-07-13 NOTE — Patient Instructions (Addendum)
Colon cancer is 3rd most diagnosed cancer and 2nd leading cause of death in both men and women 54 years of age and older despite being one of the most preventable and treatable cancers if found early.  4 of out 5 people diagnosed with colon cancer have NO prior family history.  When caught EARLY 90% of colon cancer is curable.   SCHEDULE YOUR COLONOSCOPY VERY IMPORTANT  GET EYE EXAM SINCE YOU ARE DIABETIC  Eat 3 meals a day  Phentermine  While taking the medication we may ask that you come into the office once a month or once every 2-3 months to monitor your weight, blood pressure, and heart rate. In addition we can help answer your questions about diet, exercise, and help you every step of the way with your weight loss journey. Sometime it is helpful if you bring in a food diary or use an app on your phone such as myfitnesspal to record your calorie intake, especially in the beginning.   You can start out on 1/3 to 1/2 a pill in the morning and if you are tolerating it well you can increase to one pill daily. I also have some patients that take 1/3 or 1/2 at lunch to help prevent night time eating.  This medication is cheapest CASH pay at [facility] OR [employer] OR [facility] is 14-17 dollars and you do NOT need a membership to get meds from there.    What is this medicine? PHENTERMINE (FEN ter meen) decreases your appetite. This medicine is intended to be used in addition to a healthy reduced calorie diet and exercise. The best results are achieved this way. This medicine is only indicated for short-term use. Eventually your weight loss may level out and the medication will no longer be needed.   How should I use this medicine? Take this medicine by mouth. Follow the directions on the prescription label. The tablets should stay in the bottle until immediately before you take your dose. Take your doses at regular intervals. Do not take your medicine more often than directed.  Overdosage: If you  think you have taken too much of this medicine contact a poison control center or emergency room at once. NOTE: This medicine is only for you. Do not share this medicine with others.  What if I miss a dose? If you miss a dose, take it as soon as you can. If it is almost time for your next dose, take only that dose. Do not take double or extra doses. Do not increase or in any way change your dose without consulting your doctor.  What should I watch for while using this medicine? Notify your physician immediately if you become short of breath while doing your normal activities. Do not take this medicine within 6 hours of bedtime. It can keep you from getting to sleep. Avoid drinks that contain caffeine and try to stick to a regular bedtime every night. Do not stand or sit up quickly, especially if you are an older patient. This reduces the risk of dizzy or fainting spells. Avoid alcoholic drinks.  What side effects may I notice from receiving this medicine? Side effects that you should report to your doctor or health care professional as soon as possible: -chest pain, palpitations -depression or severe changes in mood -increased blood pressure -irritability -nervousness or restlessness -severe dizziness -shortness of breath -problems urinating -unusual swelling of the legs -vomiting  Side effects that usually do not require medical attention (report to  your doctor or health care professional if they continue or are bothersome): -blurred vision or other eye problems -changes in sexual ability or desire -constipation or diarrhea -difficulty sleeping -dry mouth or unpleasant taste -headache -nausea This list may not describe all possible side effects. Call your doctor for medical advice about side effects. You may report side effects to FDA at 1-800-FDA-1088.    Who Qualifies for Obesity Medications? Although everyone is hopeful for a fast and easy way to lose weight, nothing has been  shown to replace a prudent, calorie-controlled diet along with behavior modification as a cornerstone for all obesity treatments.  The next tool that can be used to achieve weight-loss and health improvement is medication. Pharmacotherapy may be offered to individuals affected by obesity who have failed to achieve weight-loss through diet and exercise alone. Currently there are several drugs that are approved by the FDA for weight-loss: phentermine products (Adipex-P or Suprenza)  lorcaserin HCI (Belviq) phentermine- topiramate ER (Qsymia)  Bupropion; Naltrexone ER (Contrave)  Let's take a closer look at each of these medications and learn how they work:  Phentermine (Adipex-P or Suprenza) How does it work? Phentermine is a medication available by prescription that works on chemicals in the brain to decrease your appetite. It also has a mild stimulant component that adds extra energy. Phentermine is a pill that is taken once a day in the morning time. Tolerance to this medication can develop, so it can only be used for several months at a time. Common side effects are dry mouth, sleeplessness, constipation. Weight-loss: The average weight-loss is 4-5 percent of your weight after one-year. In a 200 pound person, this means about 10 pounds of weight-loss. Patients who receive phentermine can usually expect to see greater weight-loss than those who receive non-pharmacologic care, on average about 13 pounds difference over 12 weeks as reported in one study. Concerns: Due to its stimulant effect, a person's blood pressure and heart rate may increase when on this medication; therefore, you must be monitored closely by a physician who is experienced in prescribing this medication. It cannot be used in patients with some heart conditions (such as poorly controlled blood pressure), glaucoma (increased pressure in your eye), stroke or overactive thyroid. There is some concern for abuse, but this is minimal  if the medication is appropriately used as directed by a healthcare professional.  Lorcaserin (Belviq) How does it work? Lorcaserin was approved in June 2012 by the FDA and became commercially available in June 2013. It works by helping you feel full while eating less, and it works on the chemicals in your brain to help decrease your appetite. Weight-loss: In individuals who took the medication for one-year, it has been shown to have an average of 7 percent weight-loss. In a 200 pound person, this would mean a 14 pound weight-loss. Blood sugar, cholesterol and blood pressure levels have also been shown to improve. Concerns: The most common side effects are headache, dizziness, fatigue, dry mouth, upper JWJ:XBJYNWGN tract infection and nausea.  Response to therapy should be evaluated by week 12.  If a patient has not lost at least 5% of baseline body weigh  Phentermine-Topiramate ER (Qsymia) How does it work? This combination medication was approved by the FDA in July 2012. Topiramate is a medication used to treat seizures. It was found that a common side effect of this medication was weight-loss. Phentermine, as described in this brochure, helps to increase your energy and decrease your appetite. Weight-loss: Among individuals who  took the highest does of Qsymia (15 mg phentermine and 92 mg of topiramate ER) for one-year, they achieved an average of 14.4 percent weight-loss. In a 200 pound person, a 14.4 percent weight-loss would mean a loss of 29 pounds. Cholesterol levels have also been shown to improve. Concerns: The most common side effects were dry mouth, constipation and pins-and-needle feeling in extremities. Qsymia should NOT be taken during pregnancy since Topiramate ER, a component of Qsymia, has been associated with an increased risk of birth defects.  Bupropion; Naltrexone ER (Contrave) How does it work? Works in two areas of your brain, hunger center and reward center to reduce  hunger and cravings.  Weight loss In a 56 week trial patients lost more than 5% of their body weight.  Concerns Most common side effects are dry mouth, constipation or diarrhea, headache.  Please take it with a full glass of water and low fat meal.    Follow-up Visits: Patients are given the opportunity to revisit a topic or obtain more information on an area of interest during follow-up visits. The frequency of and interval between follow-up visits is determined on a patient-by-patient basis. Frequent visits (every 3 to 4 weeks) are encouraged until initial weight-loss goals (5 to 10 percent of body weight) are achieved. At that point, less frequent visits are typically scheduled as needed for individual patients. However, since obesity is considered a chronic life-long problem for many individuals, periodic continual follow up is recommended.   Research has shown that weight-loss as low as 5 percent of initial body weight can lead to favorable improvements in blood pressure, cholesterol, glucose levels and insulin sensitivity. The risk of developing heart disease is reduced the most in patients who have impaired glucose tolerance, type 2 diabetes or high blood pressure.     Simple math prevails.    1st - exercise does not produce significant weight loss - at best one converts fat into muscle , "bulks up", loses inches, but usually stays "weight neutral"     2nd - think of your body weightas a check book: If you eat more calories than you burn up - you save money or gain weight .... Or if you spend more money than you put in the check book, ie burn up more calories than you eat, then you lose weight     3rd - if you walk or run 1 mile, you burn up 100 calories - you have to burn up 3,500 calories to lose 1 pound, ie you have to walk/run 35 miles to lose 1 measly pound. So if you want to lose 10 #, then you have to walk/run 350 miles, so.... clearly exercise is not the solution.     4. So if  you consume 1,500 calories, then you have to burn up the equivalent of 15 miles to stay weight neutral - It also stands to reason that if you consume 1,500 cal/day and don't lose weight, then you must be burning up about 1,500 cals/day to stay weight neutral.     5. If you really want to lose weight, you must cut your calorie intake 300 calories /day and at that rate you should lose about 1 # every 3 days.   6. Please purchase Dr Francis Dowse Fuhrman's book(s) "The End of Dieting" & "Eat to Live" . It has some great concepts and recipes.      We want weight loss that will last so you should lose 1-2 pounds a week.  THAT IS IT! Please pick THREE things a month to change. Once it is a habit check off the item. Then pick another three items off the list to become habits.  If you are already doing a habit on the list GREAT!  Cross that item off! o Don't drink your calories. Ie, alcohol, soda, fruit juice, and sweet tea.  o Drink more water. Drink a glass when you feel hungry or before each meal.  o Eat breakfast - Complex carb and protein (likeDannon light and fit yogurt, oatmeal, fruit, eggs, [place] bacon). o Measure your cereal.  Eat no more than one cup a day. (ie [place]) o Eat an apple a day. o Add a vegetable a day. o Try a new vegetable a month. o Use Pam! Stop using oil or butter to cook. o Don't finish your plate or use smaller plates. o Share your dessert. o Eat sugar free Jello for dessert or frozen grapes. o Don't eat 2-3 hours before bed. o Switch to whole wheat bread, pasta, and brown rice. o Make healthier choices when you eat out. No fries! o Pick baked chicken, NOT fried. o Don't forget to SLOW DOWN when you eat. It is not going anywhere.  o Take the stairs. o Park far away in the parking lot o [employer] (or weights) for 10 minutes while watching TV. o Walk at work for 10 minutes during break. o Walk outside 1 time a week with your friend, kids, dog, or significant  other. o Start a walking group at church. o Walk the mall as much as you can tolerate.  o Keep a food diary. o Weigh yourself daily. o Walk for 15 minutes 3 days per week. o Cook at home more often and eat out less.  If life happens and you go back to old habits, it is okay.  Just start over. You can do it!   If you experience chest pain, get short of breath, or tired during the exercise, please stop immediately and inform your doctor.

## 2016-07-14 LAB — MAGNESIUM: MAGNESIUM: 1.7 mg/dL (ref 1.5–2.5)

## 2016-07-14 LAB — MICROALBUMIN / CREATININE URINE RATIO
Creatinine, Urine: 70 mg/dL (ref 20–320)
Microalb Creat Ratio: 10 mcg/mg creat (ref <30)
Microalb, Ur: 0.7 mg/dL

## 2016-07-14 LAB — URINALYSIS, ROUTINE W REFLEX MICROSCOPIC
Bilirubin Urine: NEGATIVE
Glucose, UA: NEGATIVE
HGB URINE DIPSTICK: NEGATIVE
KETONES UR: NEGATIVE
Leukocytes, UA: NEGATIVE
Nitrite: NEGATIVE
PH: 6 (ref 5.0–8.0)
PROTEIN: NEGATIVE
Specific Gravity, Urine: 1.018 (ref 1.001–1.035)

## 2016-07-14 LAB — TSH: TSH: 2.14 mIU/L

## 2016-07-14 LAB — HEMOGLOBIN A1C
Hgb A1c MFr Bld: 7.2 % — ABNORMAL HIGH (ref <5.7)
Mean Plasma Glucose: 160 mg/dL

## 2016-07-14 LAB — FERRITIN: Ferritin: 96 ng/mL (ref 10–232)

## 2016-07-14 LAB — VITAMIN D 25 HYDROXY (VIT D DEFICIENCY, FRACTURES): VIT D 25 HYDROXY: 52 ng/mL (ref 30–100)

## 2016-08-12 ENCOUNTER — Other Ambulatory Visit: Payer: Self-pay | Admitting: Internal Medicine

## 2016-08-13 NOTE — Progress Notes (Signed)
56 y.o.female presents for a follow up after being on phentermine for weight loss, just on 1/2 pill.  While on the medication they have lost 5 lbs since last visit. They deny palpitations, anxiety, trouble sleeping, elevated BP. Some constipation.   BMI is Body mass index is 42.78 kg/m., she is working on diet and exercise. Wt Readings from Last 3 Encounters:  08/14/16 249 lb 3.2 oz (113 kg)  07/13/16 253 lb (114.8 kg)  11/07/15 248 lb (112.5 kg)   Typical breakfast: oatmeal Typical lunch: lean cuizine Typical dinner: salads  Medications: Current Outpatient Prescriptions on File Prior to Visit  Medication Sig Dispense Refill  . cetirizine (ZYRTEC) 10 MG tablet Take 10 mg by mouth daily.    . Coenzyme Q10 (CO Q 10 PO) Take 200 mg by mouth.    . escitalopram (LEXAPRO) 20 MG tablet Take 1/2 to 1 tablet daily for Mood as directed 30 tablet 0  . FIBER PO Take by mouth.    . hydrochlorothiazide (HYDRODIURIL) 25 MG tablet take 1 tablet by mouth once daily 90 tablet 0  . Multiple Vitamins-Minerals (MULTIVITAMIN PO) Take by mouth daily.    . phentermine (ADIPEX-P) 37.5 MG tablet Take 1 tablet (37.5 mg total) by mouth daily before breakfast. 30 tablet 2   No current facility-administered medications on file prior to visit.     ROS: All negative except for above  Physical exam: Vitals:   08/14/16 1530  BP: 128/70  Pulse: 87  Resp: 14  Temp: 97.7 F (36.5 C)   Physical Exam  Constitutional: She is oriented to person, place, and time. She appears well-developed and well-nourished.  HENT:  Head: Normocephalic and atraumatic.  Right Ear: External ear normal.  Left Ear: External ear normal.  Mouth/Throat: Oropharynx is clear and moist.  Eyes: Conjunctivae and EOM are normal. Pupils are equal, round, and reactive to light.  Neck: Normal range of motion. Neck supple. No thyromegaly present.  Cardiovascular: Normal rate, regular rhythm and normal heart sounds.  Exam reveals no gallop and  no friction rub.   No murmur heard. Pulmonary/Chest: Effort normal and breath sounds normal. No respiratory distress. She has no wheezes.  Abdominal: Soft. Bowel sounds are normal. She exhibits no distension and no mass. There is no tenderness. There is no rebound and no guarding.  obese  Musculoskeletal: Normal range of motion.  Lymphadenopathy:    She has no cervical adenopathy.  Neurological: She is alert and oriented to person, place, and time. She displays normal reflexes. No cranial nerve deficit. Coordination normal.  Skin: Skin is warm and dry.  Psychiatric: She has a normal mood and affect.   Assessment: Obesity with co morbid conditions.   Plan: General weight loss/lifestyle modification strategies discussed (elicit support from others; identify saboteurs; non-food rewards, etc). Continue food diary Continue restricted calorie diet Continue daily exercise as well as behavior modification such as walking further away, putting down the fork, having a plan, using stairs, etc.  Medication: phentermine  Follow up in 3 months Future Appointments Date Time Provider Department Center  07/26/2017 9:30 AM Quentin Mullingollier, Kena Limon, PA-C [id number] None

## 2016-08-14 ENCOUNTER — Encounter: Payer: Self-pay | Admitting: Physician Assistant

## 2016-08-14 ENCOUNTER — Ambulatory Visit (INDEPENDENT_AMBULATORY_CARE_PROVIDER_SITE_OTHER): Payer: 59 | Admitting: Physician Assistant

## 2016-08-14 VITALS — BP 128/70 | HR 87 | Temp 97.7°F | Resp 14 | Ht 64.0 in | Wt 249.2 lb

## 2016-08-14 DIAGNOSIS — E119 Type 2 diabetes mellitus without complications: Secondary | ICD-10-CM

## 2016-08-14 NOTE — Patient Instructions (Signed)
Simple math prevails.    1st - exercise does not produce significant weight loss - at best one converts fat into muscle , "bulks up", loses inches, but usually stays "weight neutral"     2nd - think of your body weightas a check book: If you eat more calories than you burn up - you save money or gain weight .... Or if you spend more money than you put in the check book, ie burn up more calories than you eat, then you lose weight     3rd - if you walk or run 1 mile, you burn up 100 calories - you have to burn up 3,500 calories to lose 1 pound, ie you have to walk/run 35 miles to lose 1 measly pound. So if you want to lose 10 #, then you have to walk/run 350 miles, so.... clearly exercise is not the solution.     4. So if you consume 1,500 calories, then you have to burn up the equivalent of 15 miles to stay weight neutral - It also stands to reason that if you consume 1,500 cal/day and don't lose weight, then you must be burning up about 1,500 cals/day to stay weight neutral.     5. If you really want to lose weight, you must cut your calorie intake 300 calories /day and at that rate you should lose about 1 # every 3 days.   6. Please purchase Dr Fara Olden Fuhrman's book(s) "The End of Dieting" & "Eat to Live" . It has some great concepts and recipes.      Potassium Content of Foods  The body needs potassium to control blood pressure and to keep the muscles and nervous system healthy. Here are some healthy foods below that are high in potassium. Also you can get the white label salt of "NO SALT" salt substitute, 1/4 teaspoon of this is equivalent to 48meq potassium.   FOODS AND DRINKS HIGH IN POTASSIUM FOODS MODERATE IN POTASSIUM   Fruits  Avocado (cubed),  c / 50 g.  Banana (sliced), 75 g.  Cantaloupe (cubed), 80 g.  Honeydew, 1 wedge / 85 g.  Kiwi (sliced), 90 g.  Nectarine, 1 small / 129 g.  Orange, 1 medium / 131 g. Vegetables  Artichoke,  of a medium / 64 g.  Asparagus  (boiled), 90 g..  Broccoli (boiled), 78 g.  Brussels sprout (boiled), 78 g.  Butternut squash (baked), 103 g.  Chickpea (cooked), 82 g.  Green peas (cooked), 80 g.  Kidney beans (cooked), 5 tbsp / 55 g.  Lima beans (cooked),  c / 43 g.  Navy beans (cooked),  c / 61 g.  Spinach (cooked),  c / 45 g.  Sweet potato (baked),  c / 50 g.  Tomato (chopped or sliced), 90 g.  Vegetable juice.  White mushrooms (cooked), 78 g.  Yam (cooked or baked),  c / 34 g.  Zucchini squash (boiled), 90 g. Other Foods and Drinks  Almonds (whole),  c / 36 g.  Fish, 3 oz / 85 g.  Nonfat fruit variety yogurt, 123 g.  Pistachio nuts, 1 oz / 28 g.  Pumpkin seeds, 1 oz / 28 g.  Red meat (broiled, cooked, grilled), 3 oz / 85 g.  Scallops (steamed), 3 oz / 85 g.  Spaghetti sauce,  c / 66 g.  Sunflower seeds (dry roasted), 1 oz / 28 g.  Veggie burger, 1 patty / 70 g. Fruits  Grapefruit,  of the fruit /  123 g  Plums (sliced), 83 g.  Tangerine, 1 large / 120 g. Vegetables  Carrots (boiled), 78 g.  Carrots (sliced), 61 g.  Rhubarb (cooked with sugar), 120 g.  Rutabaga (cooked), 120 g.  Yellow snap beans (cooked), 63 g. Other Foods and Drinks   Chicken breast (roasted and chopped),  c / 70 g.  Pita bread, 1 large / 64 g.  Shrimp (steamed), 4 oz / 113 g.  Swiss cheese (diced), 70 g.         Bad carbs also include fruit juice, alcohol, and sweet tea. These are empty calories that do not signal to your brain that you are full.   Please remember the good carbs are still carbs which convert into sugar. So please measure them out no more than 1/2-1 cup of rice, oatmeal, pasta, and beans  Veggies are however free foods! Pile them on.   Not all fruit is created equal. Please see the list below, the fruit at the bottom is higher in sugars than the fruit at the top. Please avoid all dried fruits.

## 2016-10-12 ENCOUNTER — Other Ambulatory Visit: Payer: Self-pay | Admitting: Internal Medicine

## 2016-10-14 ENCOUNTER — Other Ambulatory Visit: Payer: Self-pay | Admitting: Internal Medicine

## 2016-12-12 ENCOUNTER — Ambulatory Visit: Payer: Self-pay | Admitting: Physician Assistant

## 2017-01-17 ENCOUNTER — Other Ambulatory Visit: Payer: Self-pay | Admitting: Physician Assistant

## 2017-01-31 ENCOUNTER — Ambulatory Visit: Payer: Self-pay | Admitting: Physician Assistant

## 2017-02-07 DIAGNOSIS — Z1231 Encounter for screening mammogram for malignant neoplasm of breast: Secondary | ICD-10-CM | POA: Diagnosis not present

## 2017-02-15 DIAGNOSIS — R922 Inconclusive mammogram: Secondary | ICD-10-CM | POA: Diagnosis not present

## 2017-02-15 DIAGNOSIS — Z803 Family history of malignant neoplasm of breast: Secondary | ICD-10-CM | POA: Diagnosis not present

## 2017-02-15 DIAGNOSIS — N6001 Solitary cyst of right breast: Secondary | ICD-10-CM | POA: Diagnosis not present

## 2017-02-15 LAB — HM MAMMOGRAPHY

## 2017-02-17 ENCOUNTER — Other Ambulatory Visit: Payer: Self-pay | Admitting: Internal Medicine

## 2017-02-19 ENCOUNTER — Encounter: Payer: Self-pay | Admitting: *Deleted

## 2017-02-25 ENCOUNTER — Ambulatory Visit: Payer: Self-pay | Admitting: Physician Assistant

## 2017-03-13 ENCOUNTER — Encounter: Payer: Self-pay | Admitting: Physician Assistant

## 2017-04-10 ENCOUNTER — Ambulatory Visit: Payer: Self-pay | Admitting: Physician Assistant

## 2017-04-19 ENCOUNTER — Other Ambulatory Visit: Payer: Self-pay | Admitting: Internal Medicine

## 2017-05-14 ENCOUNTER — Ambulatory Visit: Payer: Self-pay | Admitting: Physician Assistant

## 2017-07-23 ENCOUNTER — Other Ambulatory Visit: Payer: Self-pay

## 2017-07-23 MED ORDER — ESCITALOPRAM OXALATE 20 MG PO TABS: ORAL_TABLET | ORAL | 0 refills | Status: DC

## 2017-07-26 ENCOUNTER — Encounter: Payer: Self-pay | Admitting: Physician Assistant

## 2017-08-15 DIAGNOSIS — N6001 Solitary cyst of right breast: Secondary | ICD-10-CM | POA: Diagnosis not present

## 2017-08-15 LAB — HM MAMMOGRAPHY

## 2017-08-19 ENCOUNTER — Encounter: Payer: Self-pay | Admitting: *Deleted

## 2017-08-19 ENCOUNTER — Encounter: Payer: Self-pay | Admitting: Physician Assistant

## 2017-09-10 ENCOUNTER — Encounter: Payer: Self-pay | Admitting: Adult Health

## 2017-10-28 ENCOUNTER — Other Ambulatory Visit: Payer: Self-pay | Admitting: Internal Medicine

## 2017-10-28 NOTE — Progress Notes (Deleted)
Complete Physical  Assessment and Plan:   Routine general medical examination at a health care facility Schedule colonoscopy and schedule eye exam  Essential hypertension - continue medications, DASH diet, exercise and monitor at home. Call if greater than 130/80.  -     CBC with Differential/Platelet -     CMP/GFR -     TSH -     Urinalysis, Routine w reflex microscopic -     Microalbumin / creatinine urine ratio -     EKG 12-Lead  Diabetes mellitus without complication (HCC) Discussed general issues about diabetes pathophysiology and management., Educational material distributed., Suggested low cholesterol diet., Encouraged aerobic exercise., Discussed foot care., Reminded to get yearly retinal exam. -     Hemoglobin A1c  Hyperlipidemia, unspecified hyperlipidemia type -continue medications, check lipids, decrease fatty foods, increase activity.  -     Lipid panel  Severe obesity (BMI >= 40) (HCC) -     phentermine (ADIPEX-P) 37.5 MG tablet; Take 1 tablet (37.5 mg total) by mouth daily before breakfast. Will do phentermine, follow up 1 month  - long discussion about weight loss, diet, and exercise  Vitamin D deficiency -     VITAMIN D 25 Hydroxy (Vit-D Deficiency, Fractures)  Medication management -     Magnesium  History of nephrolithiasis Increase water  Anxiety Increase lexapro to 20mg  daily  Allergic state, subsequent encounter   Continue diet and meds as discussed. Further disposition pending results of labs. Discussed med's effects and SE's.  Future Appointments  Date Time Provider Department Center  10/29/2017  2:00 PM Judd Gaudierorbett, Mikiah Demond, NP [facility] None     HPI 57 y.o. female  presents for CPE. She has has Hypertension; Environmental allergies; Anxiety; Hyperlipidemia associated with type 2 diabetes mellitus (HCC); Diabetes mellitus without complication (HCC); History of nephrolithiasis; Severe obesity (BMI >= 40) (HCC); Vitamin D deficiency; and  Medication management on their problem list.  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   she is prescribed phentermine for weight loss.  While on the medication they have lost {NUMBERS 0-12:18577} lbs since last visit. They deny palpitations, anxiety, trouble sleeping, elevated BP.   BMI is There is no height or weight on file to calculate BMI., she is working on diet and exercise. Wt Readings from Last 3 Encounters:  08/14/16 249 lb 3.2 oz (113 kg)  07/13/16 253 lb (114.8 kg)  11/07/15 248 lb (112.5 kg)    Her blood pressure {HAS HAS NOT:18834} been controlled at home, today their BP is     She {DOES_DOES EAV:40981}OT:18564} workout. She denies chest pain, shortness of breath, dizziness.    She {ACTION; IS/IS [id number]}OT:[id number]} on cholesterol medication and denies myalgias. Her cholesterol {ACTION; IS/IS NOT:21021397} at goal. The cholesterol last visit was:   Lab Results  Component Value Date   CHOL 209 (H) 07/13/2016   HDL 43 (L) 07/13/2016   LDLCALC 128 (H) 07/13/2016   TRIG 189 (H) 07/13/2016   CHOLHDL 4.9 07/13/2016   She has been working on diet and exercise for Diabetes without complications, not on medication at this time, she is on bASA, does not check sugars at Home, and denies paresthesia of the feet, polydipsia, polyuria and visual disturbances  Lab Results  Component Value Date   HGBA1C 7.2 (H) 07/13/2016   Lab Results  Component Value Date   GFRNONAA >89 07/13/2016   Patient is on Vitamin D supplement.  Lab Results  Component Value Date   VD25OH 52 07/13/2016       Current Medications:   Current Outpatient Medications:  .  cetirizine (ZYRTEC) 10 MG tablet, Take 10 mg by mouth daily., Disp: , Rfl:  .  Coenzyme Q10 (CO Q 10 PO), Take 200 mg by mouth., Disp: , Rfl:  .  escitalopram (LEXAPRO) 20 MG tablet, take 1/2 to 1 tablet by mouth once daily for MOOD AS DIRECTED, Disp: 90 tablet, Rfl: 0 .   FIBER PO, Take by mouth., Disp: , Rfl:  .  hydrochlorothiazide (HYDRODIURIL) 25 MG tablet, take 1 tablet by mouth once daily, Disp: 90 tablet, Rfl: 1 .  Multiple Vitamins-Minerals (MULTIVITAMIN PO), Take by mouth daily., Disp: , Rfl:  .  phentermine (ADIPEX-P) 37.5 MG tablet, Take 1 tablet (37.5 mg total) by mouth daily before breakfast., Disp: 30 tablet, Rfl: 2   Medical History:  Past Medical History:  Diagnosis Date  . Allergy   . Anxiety   . History of nephrolithiasis   . Hyperlipidemia   . Hypertension   . Prediabetes    Preventative Medicine Immunization History  Administered Date(s) Administered  . Influenza Split 12/01/2013  . Influenza, Seasonal, Injecte, Preservative Fre 03/02/2015  . Pneumococcal Polysaccharide-23 06/09/2013  . Tdap 08/11/2007   Tetanus: 2009 *** Pneumovax: 2015 Prevnar 13: due at age 45 Flu vaccine: 2016 did not get this year Zostavax: N/A  Pap: 2016, never abnormal pap, will get every 5 years MGM: 07/2017 DEXA: N/A Colonoscopy: overdue *** EGD: N/A  Vision:  Dental:   Allergies Allergies  Allergen Reactions  . Codeine Nausea Only  . Penicillins     SURGICAL HISTORY She  has a past surgical history that includes Tonsillectomy and adenoidectomy and Cesarean section (1995). FAMILY HISTORY Her family history includes Cancer in her father; Hypertension in her father and mother. SOCIAL HISTORY She  reports that she has quit smoking. She has never used smokeless tobacco. She reports that she does not drink alcohol or use drugs.  Review of Systems  Constitutional: Negative.  Negative for malaise/fatigue and weight loss.  HENT: Negative.  Negative for hearing loss and tinnitus.   Eyes: Negative.  Negative for blurred vision and double vision.  Respiratory: Negative.  Negative for cough, sputum production, shortness of breath and wheezing.   Cardiovascular: Negative.  Negative for chest pain, palpitations, orthopnea, claudication, leg  swelling and PND.  Gastrointestinal: Negative.  Negative for abdominal pain, blood in stool, constipation, diarrhea, heartburn, melena, nausea and vomiting.  Genitourinary: Negative.   Musculoskeletal: Negative.  Negative for falls, joint pain and myalgias.  Skin: Negative.  Negative for rash.  Neurological: Negative.  Negative for dizziness, tingling, sensory change, weakness and headaches.  Endo/Heme/Allergies: Negative.  Negative for polydipsia.  Psychiatric/Behavioral: Negative.  Negative for depression, memory loss, substance abuse and suicidal ideas. The patient is not nervous/anxious and does not have insomnia.   All other systems reviewed and are negative.   Physical Exam: There were no vitals taken for this visit. Wt Readings from Last 3 Encounters:  08/14/16 249 lb 3.2 oz (113 kg)  07/13/16 253 lb (114.8 kg)  11/07/15 248 lb (112.5 kg)   General Appearance: Well nourished, in no apparent distress. Eyes: PERRLA, EOMs, conjunctiva no swelling or erythema Sinuses: No Frontal/maxillary tenderness ENT/Mouth: Ext aud canals clear, TMs without erythema, bulging. No erythema, swelling, or exudate on post pharynx.  Tonsils not swollen or erythematous. Hearing normal.  Neck: Supple, thyroid normal.  Respiratory:  Respiratory effort normal, BS equal bilaterally without rales, rhonchi, wheezing or stridor.  Cardio: RRR with no MRGs. Brisk peripheral pulses without edema.  Abdomen: Soft, + BS.  Non tender, no guarding, rebound, hernias, masses. Lymphatics: Non tender without lymphadenopathy.  Musculoskeletal: Full ROM, 5/5 strength, normal gait.  Skin: Warm, dry without rashes, lesions, ecchymosis.  Neuro: Cranial nerves intact. No cerebellar symptoms. Sensation intact.  Psych: Awake and oriented X 3, normal affect, Insight and Judgment appropriate.   EKG WNL, no ST changes  Dan Maker, NP 10:17 AM [facility]

## 2017-10-29 ENCOUNTER — Encounter: Payer: Self-pay | Admitting: Adult Health

## 2017-10-30 ENCOUNTER — Telehealth: Payer: Self-pay

## 2017-10-30 MED ORDER — HYDROCHLOROTHIAZIDE 25 MG PO TABS: 25.0000 mg | ORAL_TABLET | Freq: Every day | ORAL | 1 refills | Status: DC

## 2017-10-30 MED ORDER — ESCITALOPRAM OXALATE 20 MG PO TABS: ORAL_TABLET | ORAL | 0 refills | Status: DC

## 2017-10-30 NOTE — Telephone Encounter (Signed)
Refill request for Lexapro and HCTZ. Patient has CPE scheduled for 12/05/17

## 2017-12-03 NOTE — Progress Notes (Signed)
Complete Physical  Assessment and Plan:    Essential hypertension - continue medications, DASH diet, exercise and monitor at home. Call if greater than 130/80.  -     CBC with Differential/Platelet -     COMPLETE METABOLIC PANEL WITH GFR -     TSH -     Urinalysis, Routine w reflex microscopic -     Microalbumin / creatinine urine ratio -     EKG 12-Lead  Hyperlipidemia associated with type 2 diabetes mellitus (HCC) -     Lipid panel - declines statins at this time, will continues to discuss - not at goal  Diabetes mellitus without complication (HCC) -     Hemoglobin A1c - follow up in 1 month -     empagliflozin (JARDIANCE) 25 MG TABS tablet; Take 25 mg by mouth daily.  Environmental allergies Continue meds  Anxiety Continue meds  History of nephrolithiasis Increase water  Severe obesity (BMI >= 40) (HCC) - follow up 3 months for progress monitoring - increase veggies, decrease carbs - long discussion about weight loss, diet, and exercise  Vitamin D deficiency Continue supplement  Medication management -     Magnesium  Screening, anemia, deficiency, iron -     Iron,Total/Total Iron Binding Cap   Continue diet and meds as discussed. Further disposition pending results of labs. Discussed med's effects and SE's.  Future Appointments  Date Time Provider Department Center  12/09/2018  3:00 PM Quentin Mulling, PA-C [facility] None     HPI 57 y.o. female  presents for CPE and 3 month follow up with hypertension, hyperlipidemia, diabetes and vitamin D. Her blood pressure has been controlled at home, today their BP is BP: 136/82 She does workout. She denies chest pain, shortness of breath, dizziness.  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   BMI is Body mass index is 42.98 kg/m., she is working on diet and exercise. Wt Readings from Last 3 Encounters:  12/05/17 250 lb 6.4 oz (113.6 kg)   08/14/16 249 lb 3.2 oz (113 kg)  07/13/16 253 lb (114.8 kg)   She is on cholesterol medication and denies myalgias. Her cholesterol is at goal. Lab Results  Component Value Date   CHOL 209 (H) 07/13/2016   HDL 43 (L) 07/13/2016   LDLCALC 128 (H) 07/13/2016   TRIG 189 (H) 07/13/2016   CHOLHDL 4.9 07/13/2016   She has been working on diet and exercise for Diabetes without complications, not on medication at this time, she is on bASA, does not check sugars at  Home, and denies paresthesia of the feet, polydipsia, polyuria and visual disturbances  Lab Results  Component Value Date   HGBA1C 7.2 (H) 07/13/2016   Lab Results  Component Value Date   GFRNONAA >89 07/13/2016  Patient is on Vitamin D supplement. She is doing well on the lexapro.  She has history of kidney stones.  She is on thyroid medication at this time.  Lab Results  Component Value Date   TSH 2.14 07/13/2016    Current Medications:    Current Outpatient Medications (Cardiovascular):  .  hydrochlorothiazide (HYDRODIURIL) 25 MG tablet, Take 1 tablet (25 mg total) by mouth daily.  Current Outpatient Medications (Respiratory):  .  cetirizine (ZYRTEC) 10 MG tablet, Take 10 mg by mouth daily.    Current Outpatient Medications (Other):  Marland Kitchen  Coenzyme Q10 (CO Q 10 PO), Take 200 mg by mouth. Marland Kitchen  escitalopram (LEXAPRO) 20 MG tablet, take 1/2 to 1 tablet by mouth once daily for MOOD AS DIRECTED .  FIBER PO, Take by mouth. .  Multiple Vitamins-Minerals (MULTIVITAMIN PO), Take by mouth daily. .  phentermine (ADIPEX-P) 37.5 MG tablet, Take 1 tablet (37.5 mg total) by mouth daily before breakfast.  Medical History:  Past Medical History:  Diagnosis Date  . Allergy   . Anxiety   . History of nephrolithiasis   . Hyperlipidemia   . Hypertension   . Prediabetes    Preventative Medicine Immunization History  Administered Date(s) Administered  . Influenza Split 12/01/2013  . Influenza, Seasonal, Injecte, Preservative  Fre 03/02/2015  . Pneumococcal Polysaccharide-23 06/09/2013  . Tdap 08/11/2007   Tetanus: 2009 due next year Pneumovax: 2015 Prevnar 13: due at age 71 Flu vaccine:DUE Zostavax: N/A  Pap: 2016, never abnormal pap, will get every 5 years 3DMGM: 07/2017 DEXA: N/A Colonoscopy: will schedule EGD: N/A  Allergies Allergies  Allergen Reactions  . Codeine Nausea Only  . Penicillins     SURGICAL HISTORY She  has a past surgical history that includes Tonsillectomy and adenoidectomy and Cesarean section (1995). FAMILY HISTORY Her family history includes Cancer in her father; Hypertension in her father and mother. SOCIAL HISTORY She  reports that she has quit smoking. She has never used smokeless tobacco. She reports that she does not drink alcohol or use drugs.  Review of Systems  Constitutional: Negative.   HENT: Negative.   Eyes: Negative.   Respiratory: Negative.   Cardiovascular: Negative.   Gastrointestinal: Negative.   Genitourinary: Negative.   Musculoskeletal: Negative.   Skin: Negative.   Neurological: Negative.   Endo/Heme/Allergies: Negative.   Psychiatric/Behavioral: Negative.     Physical Exam: BP 136/82   Pulse 71   Temp 98.2 F (36.8 C)   Resp 16   Wt 250 lb 6.4 oz (113.6 kg)   SpO2 98%   BMI 42.98 kg/m  Wt Readings from Last 3 Encounters:  12/05/17 250 lb 6.4 oz (113.6 kg)  08/14/16 249 lb 3.2 oz (113 kg)  07/13/16 253 lb (114.8 kg)   General Appearance: Well nourished, in no apparent distress. Eyes: PERRLA, EOMs, conjunctiva no swelling or erythema Sinuses: No Frontal/maxillary tenderness ENT/Mouth: Ext aud canals clear, TMs without erythema, bulging. No erythema, swelling, or exudate on post pharynx.  Tonsils not swollen or erythematous. Hearing normal.  Neck: Supple, thyroid normal.  Respiratory: Respiratory effort normal, BS equal bilaterally without rales, rhonchi, wheezing or stridor.  Cardio: RRR with no MRGs. Brisk peripheral pulses  without edema.  Abdomen: Soft, + BS.  Non tender, no guarding, rebound, hernias, masses. Lymphatics: Non tender without lymphadenopathy.  Musculoskeletal: Full ROM, 5/5 strength, normal gait.  Skin: Warm, dry without rashes, lesions, ecchymosis.  Neuro: Cranial nerves intact. No cerebellar symptoms. Sensation intact.  Psych: Awake and oriented X 3, normal affect, Insight and Judgment appropriate.   EKG WNL, no ST changes  Quentin Mulling, PA-C 3:23 PM [facility]

## 2017-12-05 ENCOUNTER — Encounter: Payer: Self-pay | Admitting: Physician Assistant

## 2017-12-05 ENCOUNTER — Ambulatory Visit: Payer: 59 | Admitting: Physician Assistant

## 2017-12-05 VITALS — BP 136/82 | HR 71 | Temp 98.2°F | Resp 16 | Wt 250.4 lb

## 2017-12-05 DIAGNOSIS — E119 Type 2 diabetes mellitus without complications: Secondary | ICD-10-CM | POA: Diagnosis not present

## 2017-12-05 DIAGNOSIS — E1169 Type 2 diabetes mellitus with other specified complication: Secondary | ICD-10-CM

## 2017-12-05 DIAGNOSIS — I10 Essential (primary) hypertension: Secondary | ICD-10-CM

## 2017-12-05 DIAGNOSIS — Z136 Encounter for screening for cardiovascular disorders: Secondary | ICD-10-CM

## 2017-12-05 DIAGNOSIS — Z79899 Other long term (current) drug therapy: Secondary | ICD-10-CM

## 2017-12-05 DIAGNOSIS — E785 Hyperlipidemia, unspecified: Secondary | ICD-10-CM

## 2017-12-05 DIAGNOSIS — Z0000 Encounter for general adult medical examination without abnormal findings: Secondary | ICD-10-CM | POA: Diagnosis not present

## 2017-12-05 DIAGNOSIS — F419 Anxiety disorder, unspecified: Secondary | ICD-10-CM

## 2017-12-05 DIAGNOSIS — Z130 Encounter for screening for diseases of the blood and blood-forming organs and certain disorders involving the immune mechanism: Secondary | ICD-10-CM

## 2017-12-05 DIAGNOSIS — Z87442 Personal history of urinary calculi: Secondary | ICD-10-CM

## 2017-12-05 DIAGNOSIS — E559 Vitamin D deficiency, unspecified: Secondary | ICD-10-CM

## 2017-12-05 DIAGNOSIS — Z9109 Other allergy status, other than to drugs and biological substances: Secondary | ICD-10-CM

## 2017-12-05 MED ORDER — EMPAGLIFLOZIN 25 MG PO TABS: 25.0000 mg | ORAL_TABLET | Freq: Every day | ORAL | 0 refills | Status: DC

## 2017-12-05 NOTE — Patient Instructions (Addendum)
The medication we are going to start you on is a sodium-glucose cotransporter-2 (SGLT2) inhibitors for your diabetes and weight.   This can decrease your blood pressure so please contact us if you have any dizziness, sometime we need to decrease or stop fluid pills or decrease BP meds.  You are peeing out 300-400 calories a day of sugar which can lead to yeast infections, you can take the diflucan as needed but if the infections continue we will stop the medications.  If you get any pain or discomfort around your buttocks or genitals please let us know if it does not go away. We will have you follow up in 1 month to check your kidney function and weight. Call if you need anything.   Check out  Mini habits for weight loss book  2 apps for tracking food is myfitness pal  loseit OR can take picture of your food   Your A1C is a measure of your sugar over the past 3 months and is not affected by what you have eaten over the past few days. Diabetes increases your chances of stroke and heart attack over 300 % and is the leading cause of blindness and kidney failure in the [place]. Please make sure you decrease bad carbs like white bread, white rice, potatoes, corn, soft drinks, pasta, cereals, refined sugars, sweet tea, dried fruits, and fruit juice. Good carbs are okay to eat in moderation like sweet potatoes, brown rice, whole grain pasta/bread, most fruit (except dried fruit) and you can eat as many veggies as you want.   Greater than 6.5 is considered diabetic. Between 6.4 and 5.7 is prediabetic If your A1C is less than 5.7 you are NOT diabetic.  Targets for Glucose Readings: Time of Check Target for patients WITHOUT Diabetes Target for DIABETICS  Before Meals Less than 100  less than 150  Two hours after meals Less than 200  Less than 250    Diabetes is a very complicated disease...lets simplify it.  An easy way to look at it to understand the complications is if you think of the extra  sugar floating in your blood stream as glass shards floating through your blood stream.    Diabetes affects your small vessels first: 1) The glass shards (sugar) scraps down the tiny blood vessels in your eyes and lead to diabetic retinopathy, the leading cause of blindness in the [place]. Diabetes is the leading cause of newly diagnosed adult (46 to 55 years of age) blindness in the [place].  2) The glass shards scratches down the tiny vessels of your legs leading to nerve damage called neuropathy and can lead to amputations of your feet. More than 60% of all non-traumatic amputations of lower limbs occur in people with diabetes.  3) Over time the small vessels in your brain are shredded and closed off, individually this does not cause any problems but over a long period of time many of the small vessels being blocked can lead to Vascular Dementia.   4) Your kidney's are a filter system and have a "net" that keeps certain things in the body and lets bad things out. Sugar shreds this net and leads to kidney damage and eventually failure. Decreasing the sugar that is destroying the net and certain blood pressure medications can help stop or decrease progression of kidney disease. Diabetes was the primary cause of kidney failure in 44 percent of all new cases in 2011.  5) Diabetes also destroys the small  vessels in your penis that lead to erectile dysfunction. Eventually the vessels are so damaged that you may not be responsive to cialis or viagra.   Diabetes and your large vessels: Your larger vessels consist of your coronary arteries in your heart and the carotid vessels to your brain. Diabetes or even increased sugars put you at 300% increased risk of heart attack and stroke and this is why.. The sugar scrapes down your large blood vessels and your body sees this as an internal injury and tries to repair itself. Just like you get a scab on your skin, your platelets will stick to the blood vessel  wall trying to heal it. This is why we have diabetics on low dose aspirin daily, this prevents the platelets from sticking and can prevent plaque formation. In addition, your body takes cholesterol and tries to shove it into the open wound. This is why we  want your LDL, or bad cholesterol, below 70.   The combination of platelets and cholesterol over 5-10 years forms plaque that can break off and cause a heart attack or stroke.   PLEASE REMEMBER:  Diabetes is preventable! Up to 85 percent of complications and morbidities among individuals with type 2 diabetes can be prevented, delayed, or effectively treated and minimized with regular visits to a health professional, appropriate monitoring and medication, and a healthy diet and lifestyle.   Statin therapy  In addition to the known lipid-lowering effects, statins are now widely accepted to have anti-inflammatory and immunomodulatory effects. Adjunctive use of statins has proven beneficial in the context of a wide range of inflammatory diseases, including rheumatoid arthritis.  Research has shown that the salutary effect of statins on cholesterol may not be their only benefit. Statin therapy has shown promise for everything from fighting viral infections to protecting the eye from cataracts.  A 2005 study of more than 700 hospital patients being treated for pneumonia found that the death rate was more than twice as high among those who were not using statins. In 2006, a [place] study examined the rate of sepsis, a deadly blood infection, among patients who had been hospitalized for heart events. In the two years after their hospitalization, the statin users had a rate of sepsis 19% lower than that of the non-statin users. A 2009 review of 22 studies found that statins appeared to have a beneficial effect on the outcome of infection, but they couldn't come to a firm conclusion.  VENOUS INSUFFICIENCY Our lower leg venous system is not the most  reliable, the heart does NOT pump fluid up, there is a valve system.  The muscles of the leg squeeze and the blood moves up and a valve opens and close, then they squeeze, blood moves up and valves open and closes keeping the blood moving towards the heart.  Lots can go wrong with this valve system.  If someone is sitting or standing without movement, everyone will get swelling.  THINGS TO DO:  Do not stand or sit in one position for long periods of time. Do not sit with your legs crossed. Rest with your legs raised during the day.  Your legs have to be higher than your heart so that gravity will force the valves to open, so please really elevate your legs.   Wear elastic stockings or support hose. Do not wear other tight, encircling garments around the legs, pelvis, or waist.  [employer]  has a wide variety of well priced compression stockings. [address]  [place] [address] [id number] [phone]  Or [employer] has a good cheap selection, I like the socks, they are not as hard to get on  Walk as much as possible to increase blood flow.  Raise the foot of your bed at night with 2-inch blocks.  SEEK MEDICAL CARE IF:   The skin around your ankle starts to break down.  You have pain, redness, tenderness, or hard swelling developing in your leg over a vein.  You are uncomfortable due to leg pain.  If you ever have shortness of breath with exertion or chest pain go to the ER.

## 2017-12-07 LAB — TSH: TSH: 2.88 mIU/L

## 2017-12-07 LAB — LIPID PANEL
CHOL/HDL RATIO: 6.5 (calc) — AB (ref <5.0)
Cholesterol: 265 mg/dL — ABNORMAL HIGH (ref <200)
HDL: 41 mg/dL — ABNORMAL LOW (ref >50)
NON-HDL CHOLESTEROL (CALC): 224 mg/dL — AB (ref <130)
TRIGLYCERIDES: 414 mg/dL — AB (ref <150)

## 2017-12-07 LAB — URINALYSIS, ROUTINE W REFLEX MICROSCOPIC
BILIRUBIN URINE: NEGATIVE
Glucose, UA: NEGATIVE
Hgb urine dipstick: NEGATIVE
KETONES UR: NEGATIVE
Leukocytes, UA: NEGATIVE
Nitrite: NEGATIVE
PH: 5.5 (ref 5.0–8.0)
Protein, ur: NEGATIVE
SPECIFIC GRAVITY, URINE: 1.022 (ref 1.001–1.03)

## 2017-12-07 LAB — CBC WITH DIFFERENTIAL/PLATELET
BASOS PCT: 0.9 %
Basophils Absolute: 70 cells/uL (ref 0–200)
Eosinophils Absolute: 140 cells/uL (ref 15–500)
Eosinophils Relative: 1.8 %
HCT: 43.2 % (ref 35.0–45.0)
HEMOGLOBIN: 14.5 g/dL (ref 11.7–15.5)
Lymphs Abs: 3502 cells/uL (ref 850–3900)
MCH: 29.3 pg (ref 27.0–33.0)
MCHC: 33.6 g/dL (ref 32.0–36.0)
MCV: 87.3 fL (ref 80.0–100.0)
MONOS PCT: 6.4 %
MPV: 10.1 fL (ref 7.5–12.5)
NEUTROS ABS: 3588 {cells}/uL (ref 1500–7800)
Neutrophils Relative %: 46 %
PLATELETS: 310 10*3/uL (ref 140–400)
RBC: 4.95 10*6/uL (ref 3.80–5.10)
RDW: 12.4 % (ref 11.0–15.0)
Total Lymphocyte: 44.9 %
WBC: 7.8 10*3/uL (ref 3.8–10.8)
WBCMIX: 499 {cells}/uL (ref 200–950)

## 2017-12-07 LAB — COMPLETE METABOLIC PANEL WITH GFR
AG Ratio: 1.6 (calc) (ref 1.0–2.5)
ALBUMIN MSPROF: 4.4 g/dL (ref 3.6–5.1)
ALT: 21 U/L (ref 6–29)
AST: 20 U/L (ref 10–35)
Alkaline phosphatase (APISO): 86 U/L (ref 33–130)
BILIRUBIN TOTAL: 0.3 mg/dL (ref 0.2–1.2)
BUN: 12 mg/dL (ref 7–25)
CALCIUM: 9.3 mg/dL (ref 8.6–10.4)
CO2: 24 mmol/L (ref 20–32)
CREATININE: 0.67 mg/dL (ref 0.50–1.05)
Chloride: 102 mmol/L (ref 98–110)
GFR, EST NON AFRICAN AMERICAN: 99 mL/min/{1.73_m2} (ref >=60)
GFR, Est African American: 115 mL/min/{1.73_m2} (ref >=60)
GLUCOSE: 146 mg/dL — AB (ref 65–99)
Globulin: 2.7 g/dL (calc) (ref 1.9–3.7)
Potassium: 3.9 mmol/L (ref 3.5–5.3)
Sodium: 138 mmol/L (ref 135–146)
TOTAL PROTEIN: 7.1 g/dL (ref 6.1–8.1)

## 2017-12-07 LAB — TEST AUTHORIZATION

## 2017-12-07 LAB — MICROALBUMIN / CREATININE URINE RATIO
Creatinine, Urine: 89 mg/dL (ref 20–275)
Microalb Creat Ratio: 7 mcg/mg creat (ref <30)
Microalb, Ur: 0.6 mg/dL

## 2017-12-07 LAB — IRON, TOTAL/TOTAL IRON BINDING CAP
%SAT: 19 % (ref 16–45)
IRON: 78 ug/dL (ref 45–160)
TIBC: 406 ug/dL (ref 250–450)

## 2017-12-07 LAB — HEMOGLOBIN A1C
Hgb A1c MFr Bld: 7.9 % of total Hgb — ABNORMAL HIGH (ref <5.7)
MEAN PLASMA GLUCOSE: 180 (calc)
eAG (mmol/L): 10 (calc)

## 2017-12-07 LAB — MAGNESIUM: Magnesium: 1.8 mg/dL (ref 1.5–2.5)

## 2017-12-31 NOTE — Progress Notes (Signed)
Diabetes Education and Follow-Up Visit  57 y.o. obese WF with history of DM very resistant to treatment and medications presents for diabetic education and weight loss assessment. She was started on Jardiance last visit, she stopped the HCTZ since starting the jardiance and stopped the phentermine due to trouble sleeping. No yeast infections.   Last hemoglobin A1c was: Lab Results  Component Value Date   HGBA1C 7.9 (H) 12/05/2017   HGBA1C 7.2 (H) 07/13/2016   HGBA1C 7.2 (H) 11/07/2015    BMI is Body mass index is 42.23 kg/m., she is working on diet and exercise. Wt Readings from Last 3 Encounters:  01/02/18 246 lb (111.6 kg)  12/05/17 250 lb 6.4 oz (113.6 kg)  08/14/16 249 lb 3.2 oz (113 kg)    She does not check her sugars.   Exercise: Walking and drinking lots of water.   Patient does have CKD She is not on ACE/ARB- very resistant to medications.     Lab Results  Component Value Date   GFRNONAA 99 12/05/2017    Lab Results  Component Value Date   CREATININE 0.67 12/05/2017   BUN 12 12/05/2017   NA 138 12/05/2017   K 3.9 12/05/2017   CL 102 12/05/2017   CO2 24 12/05/2017    Lab Results  Component Value Date   MICROALBUR 0.6 12/05/2017     She is not on a Statin. Resistant to medications.  She is not at goal of less than 70.  Lab Results  Component Value Date   CHOL 265 (H) 12/05/2017   HDL 41 (L) 12/05/2017   LDLCALC  12/05/2017     Comment:     . LDL cholesterol not calculated. Triglyceride levels greater than 400 mg/dL invalidate calculated LDL results. . Reference range: <100 . Desirable range <100 mg/dL for primary prevention;   <70 mg/dL for patients with CHD or diabetic patients  with > or = 2 CHD risk factors. Marland Kitchen LDL-C is now calculated using the Martin-Hopkins  calculation, which is a validated novel method providing  better accuracy than the Friedewald equation in the  estimation of LDL-C.  Horald Pollen et al. Lenox Ahr. 4132;440(10): 2061-2068   (http://education.[facility].com/faq/FAQ164)    TRIG 414 (H) 12/05/2017   CHOLHDL 6.5 (H) 12/05/2017     Problem List has Hypertension; Environmental allergies; Anxiety; Hyperlipidemia associated with type 2 diabetes mellitus (HCC); Diabetes mellitus without complication (HCC); History of nephrolithiasis; Severe obesity (BMI >= 40) (HCC); Vitamin D deficiency; and Medication management on their problem list.  Medications Current Outpatient Medications on File Prior to Visit  Medication Sig  . aspirin (ASPIRIN EC) 81 MG EC tablet Take 81 mg by mouth daily. Swallow whole.  . cetirizine (ZYRTEC) 10 MG tablet Take 10 mg by mouth daily.  . Cyanocobalamin (B-12 PO) Take by mouth.  . Cyanocobalamin (VITAMIN B12 PO) Take by mouth.  . empagliflozin (JARDIANCE) 25 MG TABS tablet Take 25 mg by mouth daily.  Marland Kitchen escitalopram (LEXAPRO) 20 MG tablet take 1/2 to 1 tablet by mouth once daily for MOOD AS DIRECTED  . FIBER PO Take by mouth.  . Multiple Vitamins-Minerals (MULTIVITAMIN PO) Take by mouth daily.  . Coenzyme Q10 (CO Q 10 PO) Take 200 mg by mouth.  . hydrochlorothiazide (HYDRODIURIL) 25 MG tablet Take 1 tablet (25 mg total) by mouth daily.  . phentermine (ADIPEX-P) 37.5 MG tablet Take 1 tablet (37.5 mg total) by mouth daily before breakfast.   No current facility-administered medications on file prior to visit.  ROS- see HPI  Physical Exam: Blood pressure 136/86, pulse 63, temperature (!) 97.5 F (36.4 C), weight 246 lb (111.6 kg), SpO2 96 %. Body mass index is 42.23 kg/m. General Appearance: Well nourished, in no apparent distress. Eyes: PERRLA, EOMs, conjunctiva no swelling or erythema ENT/Mouth: Ext aud canals clear, TMs without erythema, bulging. No erythema, swelling, or exudate on post pharynx.  Tonsils not swollen or erythematous. Hearing normal.  Respiratory: Respiratory effort normal, BS equal bilaterally without rales, rhonchi, wheezing or stridor.  Cardio: RRR with  no MRGs. Brisk peripheral pulses without edema.  Abdomen: Soft, + BS.  Non tender, no guarding, rebound, hernias, masses. Musculoskeletal: Full ROM, 5/5 strength, normal gait.  Skin: Warm, dry without rashes, lesions, ecchymosis.  Neuro: Cranial nerves intact. Normal muscle tone, no cerebellar symptoms. Sensation intact.    Plan and Assessment: Diabetes and morbid obesity resistant to medications Education: Reviewed 'ABCs' of diabetes management (respective goals in parentheses):  A1C (<7), blood pressure (<130/80), and cholesterol (LDL <70) Eye Exam yearly and Dental Exam every 6 months. Very resistant to medications, continue jardiance for now, add cinnamon 1000mg  BID Follow up 2 months Dietary recommendations Physical Activity recommendations - Strongly advised her to start checking sugars at different times of the day - check 2 times a day, rotating checks - given sugar log and advised how to fill it and to bring it at next appt  - given foot care handout and explained the principles  - given instructions for hypoglycemia management    Future Appointments  Date Time Provider Department Center  12/09/2018  3:00 PM Quentin Mulling, PA-C [facility] None

## 2018-01-02 ENCOUNTER — Ambulatory Visit (INDEPENDENT_AMBULATORY_CARE_PROVIDER_SITE_OTHER): Payer: 59 | Admitting: Physician Assistant

## 2018-01-02 ENCOUNTER — Encounter: Payer: Self-pay | Admitting: Physician Assistant

## 2018-01-02 VITALS — BP 136/86 | HR 63 | Temp 97.5°F | Wt 246.0 lb

## 2018-01-02 DIAGNOSIS — E1169 Type 2 diabetes mellitus with other specified complication: Secondary | ICD-10-CM | POA: Diagnosis not present

## 2018-01-02 DIAGNOSIS — E119 Type 2 diabetes mellitus without complications: Secondary | ICD-10-CM

## 2018-01-02 DIAGNOSIS — Z23 Encounter for immunization: Secondary | ICD-10-CM

## 2018-01-02 DIAGNOSIS — E785 Hyperlipidemia, unspecified: Secondary | ICD-10-CM

## 2018-01-02 NOTE — Patient Instructions (Addendum)
GET EYE EXAM AND LET us KNOW SCHEDULE YOUR COLONOSCOPY  Check out  Mini habits for weight loss book  2 apps for tracking food is myfitness pal  loseit OR can take picture of your food  GOOGLE MINDFUL EATING  Can rate how hungry you are before you eat.   Try cinnamon 1000mg  twice a day in pill for can help trigs and sugars  Your LDL is not in range or at goal, goal is less than 70.  Your LDL is the bad cholesterol that can lead to heart attack and stroke. To lower your number you can decrease your fatty foods, red meat, cheese, milk and increase fiber like whole grains and veggies. You can also add a fiber supplement like Citracel or Benefiber, these do not cause gas and bloating and are safe to use. Since you have risk factors that make [place] want your number below 70, we need to adjust or add medications to get your number below goal.   Your trigs are not in range, 12/05/2017: Triglycerides 414. Triglycerides are simple sugars in blood that are converted into a storage form. I recommend you avoid fried/greasy foods, sweets/candy, white rice , white potatoes,  anything made from white flour, sweet tea, soda, fruit juices and avoid alcohol in excess. Sweet potatoes, brown/wild rice/Quinoa, Vegetarian, spinach, or wheat pasta, Multi-grain bread - like multi-grain flat bread or sandwich thins are okay.   Continue to avoid processed foods.      When it comes to diets, agreement about the perfect plan isn't easy to find, even among the experts. Experts at the [facility] [employer] developed an idea known as the Healthy Eating Plate. Just imagine a plate divided into logical, healthy portions.  The emphasis is on diet quality:  Load up on vegetables and fruits - one-half of your plate: Aim for color and variety, and remember that potatoes don't count.  Go for whole grains - one-quarter of your plate: Whole wheat, barley, wheat berries, quinoa, oats, brown rice, and foods made with  them. If you want pasta, go with whole wheat pasta.  Protein power - one-quarter of your plate: Fish, chicken, beans, and nuts are all healthy, versatile protein sources. Limit red meat.  The diet, however, does go beyond the plate, offering a few other suggestions.  Use healthy plant oils, such as olive, canola, soy, corn, sunflower and peanut. Check the labels, and avoid partially hydrogenated oil, which have unhealthy trans fats.  If you're thirsty, drink water. Coffee and tea are good in moderation, but skip sugary drinks and limit milk and dairy products to one or two daily servings.  The type of carbohydrate in the diet is more important than the amount. Some sources of carbohydrates, such as vegetables, fruits, whole grains, and beans-are healthier than others.  Finally, stay active.

## 2018-01-03 LAB — BASIC METABOLIC PANEL WITH GFR
BUN: 13 mg/dL (ref 7–25)
CALCIUM: 9.5 mg/dL (ref 8.6–10.4)
CO2: 24 mmol/L (ref 20–32)
Chloride: 103 mmol/L (ref 98–110)
Creat: 0.66 mg/dL (ref 0.50–1.05)
GFR, EST AFRICAN AMERICAN: 115 mL/min/{1.73_m2} (ref >=60)
GFR, EST NON AFRICAN AMERICAN: 99 mL/min/{1.73_m2} (ref >=60)
Glucose, Bld: 107 mg/dL — ABNORMAL HIGH (ref 65–99)
Potassium: 4.1 mmol/L (ref 3.5–5.3)
Sodium: 138 mmol/L (ref 135–146)

## 2018-01-10 ENCOUNTER — Other Ambulatory Visit: Payer: Self-pay

## 2018-01-10 MED ORDER — EMPAGLIFLOZIN 25 MG PO TABS: 25.0000 mg | ORAL_TABLET | Freq: Every day | ORAL | 1 refills | Status: DC

## 2018-02-10 DIAGNOSIS — Z1231 Encounter for screening mammogram for malignant neoplasm of breast: Secondary | ICD-10-CM | POA: Diagnosis not present

## 2018-02-10 DIAGNOSIS — Z803 Family history of malignant neoplasm of breast: Secondary | ICD-10-CM | POA: Diagnosis not present

## 2018-02-10 LAB — HM MAMMOGRAPHY

## 2018-03-05 NOTE — Progress Notes (Signed)
Complete Physical  Assessment and Plan:    Essential hypertension - continue medications, DASH diet, exercise and monitor at home. Call if greater than 130/80.  -     CBC with Differential/Platelet -     COMPLETE METABOLIC PANEL WITH GFR -     TSH  Hyperlipidemia associated with type 2 diabetes mellitus (HCC) -     Lipid panel - declines statins at this time, will continues to discuss - will add on vascepa with new cardiovascular data and patient is willing to start - not at goal  Diabetes mellitus without complication (HCC) -     Hemoglobin A1c - follow up 3 months -     empagliflozin (JARDIANCE) 25 MG TABS tablet; Take 25 mg by mouth daily.  Severe obesity (BMI >= 40) (HCC) - follow up 3 months for progress monitoring - increase veggies, decrease carbs - long discussion about weight loss, diet, and exercise   Continue diet and meds as discussed. Further disposition pending results of labs. Discussed med's effects and SE's.  Future Appointments  Date Time Provider Department Center  12/09/2018  3:00 PM Quentin Mullingollier, Demetri Goshert, PA-C [facility] None     HPI 57 y.o. female  presents for 3 month follow up with hypertension, hyperlipidemia, diabetes and vitamin D. Her blood pressure has been controlled at home, she is off the HCTZ since on jardiance, today their BP is BP: 134/78 She does workout. She denies chest pain, shortness of breath, dizziness.  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   BMI is Body mass index is 41.2 kg/m., she is working on diet and exercise. She is not on phentermine and has been doing well with weight loss. She is walking.  Wt Readings from Last 3 Encounters:  03/06/18 240 lb (108.9 kg)  01/02/18 246 lb (111.6 kg)  12/05/17 250 lb 6.4 oz (113.6 kg)   She is on cholesterol medication and denies myalgias. Her cholesterol is at goal. Lab Results  Component Value Date   CHOL 265 (H)  12/05/2017   HDL 41 (L) 12/05/2017   LDLCALC  12/05/2017     Comment:     . LDL cholesterol not calculated. Triglyceride levels greater than 400 mg/dL invalidate calculated LDL results. . Reference range: <100 . Desirable range <100 mg/dL for primary prevention;   <70 mg/dL for patients with CHD or diabetic patients  with > or = 2 CHD risk factors. Marland Kitchen. LDL-C is now calculated using the Martin-Hopkins  calculation, which is a validated novel method providing  better accuracy than the Friedewald equation in the  estimation of LDL-C.  Horald PollenMartin SS et al. Lenox AhrJAMA. 1610;960(452013;310(19): 2061-2068  (http://education.[facility].com/faq/FAQ164)    TRIG 414 (H) 12/05/2017   CHOLHDL 6.5 (H) 12/05/2017   She has been working on diet and exercise for Diabetes without complications, on jardiance, she is on bASA, does not check sugars at  Home, and denies paresthesia of the feet, polydipsia, polyuria and visual disturbances  Lab Results  Component Value Date   HGBA1C 7.9 (H) 12/05/2017   Lab Results  Component Value Date   GFRNONAA 99 01/02/2018  Patient is on Vitamin D supplement. She is doing well on the lexapro.  She has history of kidney stones.  She is on thyroid medication at this time.  Lab Results  Component Value Date   TSH 2.88 12/05/2017    Current Medications:   Current Outpatient Medications (Endocrine & Metabolic):  .  empagliflozin (JARDIANCE) 25 MG TABS tablet, Take 25 mg by mouth daily.   Current Outpatient Medications (Respiratory):  .  cetirizine (ZYRTEC) 10 MG tablet, Take 10 mg by mouth daily.  Current Outpatient Medications (Analgesics):  .  aspirin (ASPIRIN EC) 81 MG EC tablet, Take 81 mg by mouth daily. Swallow whole.  Current Outpatient Medications (Hematological):  Marland Kitchen  Cyanocobalamin (VITAMIN B12 PO), Take by mouth.  Current Outpatient Medications (Other):  Marland Kitchen  Coenzyme Q10 (CO Q 10 PO), Take 200 mg by mouth. .  escitalopram (LEXAPRO) 20 MG tablet, take 1/2  to 1 tablet by mouth once daily for MOOD AS DIRECTED .  FIBER PO, Take by mouth. .  phentermine (ADIPEX-P) 37.5 MG tablet, Take 1 tablet (37.5 mg total) by mouth daily before breakfast.  Medical History:  Past Medical History:  Diagnosis Date  . Allergy   . Anxiety   . History of nephrolithiasis   . Hyperlipidemia   . Hypertension   . Prediabetes     Allergies Allergies  Allergen Reactions  . Codeine Nausea Only  . Penicillins     SURGICAL HISTORY She  has a past surgical history that includes Tonsillectomy and adenoidectomy and Cesarean section (1995). FAMILY HISTORY Her family history includes Cancer in her father; Hypertension in her father and mother. SOCIAL HISTORY She  reports that she has quit smoking. She has never used smokeless tobacco. She reports that she does not drink alcohol or use drugs.  Review of Systems  Constitutional: Negative.   HENT: Negative.   Eyes: Negative.   Respiratory: Negative.   Cardiovascular: Negative.   Gastrointestinal: Negative.   Genitourinary: Negative.   Musculoskeletal: Negative.   Skin: Negative.   Neurological: Negative.   Endo/Heme/Allergies: Negative.   Psychiatric/Behavioral: Negative.     Physical Exam: BP 134/78   Pulse 62   Temp (!) 97.4 F (36.3 C)   Ht 5\' 4"  (1.626 m)   Wt 240 lb (108.9 kg)   SpO2 97%   BMI 41.20 kg/m  Wt Readings from Last 3 Encounters:  03/06/18 240 lb (108.9 kg)  01/02/18 246 lb (111.6 kg)  12/05/17 250 lb 6.4 oz (113.6 kg)   General Appearance: Well nourished, in no apparent distress. Eyes: PERRLA, EOMs, conjunctiva no swelling or erythema Sinuses: No Frontal/maxillary tenderness ENT/Mouth: Ext aud canals clear, TMs without erythema, bulging. No erythema, swelling, or exudate on post pharynx.  Tonsils not swollen or erythematous. Hearing normal.  Neck: Supple, thyroid normal.  Respiratory: Respiratory effort normal, BS equal bilaterally without rales, rhonchi, wheezing or stridor.   Cardio: RRR with no MRGs. Brisk peripheral pulses without edema.  Abdomen: Soft, + BS.  Non tender, no guarding, rebound, hernias, masses. Lymphatics: Non tender without lymphadenopathy.  Musculoskeletal: Full ROM, 5/5 strength, normal gait.  Skin: Warm, dry without rashes, lesions, ecchymosis.  Neuro: Cranial nerves intact. No cerebellar symptoms. Sensation intact.  Psych: Awake and oriented X 3, normal affect, Insight and Judgment appropriate.   EKG WNL, no ST changes  Quentin Mulling, PA-C 11:28 AM [facility]

## 2018-03-06 ENCOUNTER — Encounter: Payer: Self-pay | Admitting: Physician Assistant

## 2018-03-06 ENCOUNTER — Ambulatory Visit (INDEPENDENT_AMBULATORY_CARE_PROVIDER_SITE_OTHER): Payer: 59 | Admitting: Physician Assistant

## 2018-03-06 VITALS — BP 134/78 | HR 62 | Temp 97.4°F | Ht 64.0 in | Wt 240.0 lb

## 2018-03-06 DIAGNOSIS — I10 Essential (primary) hypertension: Secondary | ICD-10-CM

## 2018-03-06 DIAGNOSIS — E119 Type 2 diabetes mellitus without complications: Secondary | ICD-10-CM

## 2018-03-06 DIAGNOSIS — E785 Hyperlipidemia, unspecified: Secondary | ICD-10-CM

## 2018-03-06 DIAGNOSIS — Z79899 Other long term (current) drug therapy: Secondary | ICD-10-CM

## 2018-03-06 DIAGNOSIS — E1169 Type 2 diabetes mellitus with other specified complication: Secondary | ICD-10-CM | POA: Diagnosis not present

## 2018-03-06 MED ORDER — EMPAGLIFLOZIN 25 MG PO TABS: 25.0000 mg | ORAL_TABLET | Freq: Every day | ORAL | 1 refills | Status: DC

## 2018-03-06 MED ORDER — ICOSAPENT ETHYL 1 G PO CAPS: 1.0000 g | ORAL_CAPSULE | Freq: Two times a day (BID) | ORAL | 3 refills | Status: DC

## 2018-03-06 NOTE — Patient Instructions (Addendum)
I would prefer for you to be on a statin cholesterol medication However Start the vascepa 1 pill twice a day Continue diet and weight loss  Statin therapy  In addition to the known lipid-lowering effects, statins are now widely accepted to have anti-inflammatory and immunomodulatory effects. Adjunctive use of statins has proven beneficial in the context of a wide range of inflammatory diseases, including rheumatoid arthritis.  Research has shown that the salutary effect of statins on cholesterol may not be their only benefit. Statin therapy has shown promise for everything from fighting viral infections to protecting the eye from cataracts.  A 2005 study of more than 700 hospital patients being treated for pneumonia found that the death rate was more than twice as high among those who were not using statins. In 2006, a [place] study examined the rate of sepsis, a deadly blood infection, among patients who had been hospitalized for heart events. In the two years after their hospitalization, the statin users had a rate of sepsis 19% lower than that of the non-statin users. A 2009 review of 22 studies found that statins appeared to have a beneficial effect on the outcome of infection, but they couldn't come to a firm conclusion.   Tennis Elbow Tennis elbow (lateral epicondylitis) is inflammation of the outer tendons of your forearm close to your elbow. Your tendons attach your muscles to your bones. The outer tendons of your forearm are used to extend your wrist, and they attach on the outside part of your elbow. Tennis elbow is often found in people who play tennis, but anyone may get the condition from repeatedly extending the wrist or turning the forearm. What are the causes? This condition is caused by repeatedly extending your wrist and using your hands. It can result from sports or work that requires repetitive forearm movements. Tennis elbow may also be caused by an injury. What increases  the risk? You have a higher risk of developing tennis elbow if you play tennis or another racquet sport. You also have a higher risk if you frequently use your hands for work. This condition is also more likely to develop in:  Musicians.  Carpenters, painters, and plumbers.  Cooks.  Cashiers.  People who work in [employer].  Holiday representative workers.  Butchers.  People who use computers.  What are the signs or symptoms? Symptoms of this condition include:  Pain and tenderness in your forearm and the outer part of your elbow. You may only feel the pain when you use your arm, or you may feel it even when you are not using your arm.  A burning feeling that runs from your elbow through your arm.  Weak grip in your hands.  How is this diagnosed? This condition may be diagnosed by medical history and physical exam. You may also have other tests, including:  X-rays.  MRI.  How is this treated? Your health care provider will recommend lifestyle adjustments, such as resting and icing your arm. Treatment may also include:  Medicines for inflammation. This may include shots of cortisone if your pain continues.  Physical therapy. This may include massage or exercises.  An elbow brace.  Surgery may eventually be recommended if your pain does not go away with treatment. Follow these instructions at home: Activity  Rest your elbow and wrist as directed by your health care provider. Try to avoid any activities that caused the problem until your health care provider says that you can do them again.  If a physical  therapist teaches you exercises, do all of them as directed.  If you lift an object, lift it with your palm facing upward. This lowers the stress on your elbow. Lifestyle  If your tennis elbow is caused by sports, check your equipment and make sure that: ? You are using it correctly. ? It is the best fit for you.  If your tennis elbow is caused by work, take breaks  frequently, if you are able. Talk with your manager about how to best perform tasks in a way that is safe. ? If your tennis elbow is caused by computer use, talk with your manager about any changes that can be made to your work environment. General instructions  If directed, apply ice to the painful area: ? Put ice in a plastic bag. ? Place a towel between your skin and the bag. ? Leave the ice on for 20 minutes, 2-3 times per day.  Take medicines only as directed by your health care provider.  If you were given a brace, wear it as directed by your health care provider.  Keep all follow-up visits as directed by your health care provider. This is important. Contact a health care provider if:  Your pain does not get better with treatment.  Your pain gets worse.  You have numbness or weakness in your forearm, hand, or fingers. This information is not intended to replace advice given to you by your health care provider. Make sure you discuss any questions you have with your health care provider. Document Released: 03/19/2005 Document Revised: 11/17/2015 Document Reviewed: 03/15/2014 Elsevier Interactive Patient Education  2018 [employer].   Tennis Elbow Rehab Ask your health care provider which exercises are safe for you. Do exercises exactly as told by your health care provider and adjust them as directed. It is normal to feel mild stretching, pulling, tightness, or discomfort as you do these exercises, but you should stop right away if you feel sudden pain or your pain gets worse. Do not begin these exercises until told by your health care provider. Stretching and range of motion exercises These exercises warm up your muscles and joints and improve the movement and flexibility of your elbow. These exercises also help to relieve pain, numbness, and tingling. Exercise A: Wrist extensor stretch 1. Extend your left / right elbow with your fingers pointing down. 2. Gently pull the palm of  your left / right hand toward you until you feel a gentle stretch on the top of your forearm. 3. To increase the stretch, push your left / right hand toward the outer edge or pinkie side of your forearm. 4. Hold this position for __________ seconds. Repeat __________ times. Complete this exercise __________ times a day. If directed by your health care provider, repeat this stretch except do it with a bent elbow this time. Exercise B: Wrist flexor stretch  1. Extend your left / right elbow and turn your palm upward. 2. Gently pull your left / right palm and fingertips back so your wrist extends and your fingers point more toward the ground. 3. You should feel a gentle stretch on the inside of your forearm. 4. Hold this position for __________ seconds. Repeat __________ times. Complete this exercise __________ times a day. If directed by your health care provider, repeat this stretch except do it with a bent elbow this time. Strengthening exercises These exercises build strength and endurance in your elbow. Endurance is the ability to use your muscles for a long time,  even after they get tired. Exercise C: Wrist extensors  1. Sit with your left / right forearm palm-down and fully supported on a table or countertop. Your elbow should be resting below the height of your shoulder. 2. Let your left / right wrist extend over the edge of the surface. 3. Loosely hold a __________ weight or a piece of rubber exercise band or tubing in your left / right hand. Slowly curl your left / right hand up toward your forearm. If you are using band or tubing, hold the band or tubing in place with your other hand to provide resistance. 4. Hold this position for __________ seconds. 5. Slowly return to the starting position. Repeat __________ times. Complete this exercise __________ times a day. Exercise D: Radial deviators  1. Stand with a __________ weight in your left / righthand. Or, sit while holding a rubber  exercise band or tubing with your other arm supported on a table or countertop. Position your hand so your thumb is on top. 2. Raise your hand upward in front of you so your thumb travels toward your forearm, or pull up on the rubber tubing. 3. Hold this position for __________ seconds. 4. Slowly return to the starting position. Repeat __________ times. Complete this exercise __________ times a day. Exercise E: Eccentric wrist extensors 1. Sit with your left / right forearm palm-down and fully supported on a table or countertop. Your elbow should be resting below the height of your shoulder. 2. If told by your health care provider, hold a __________ weight in your hand. 3. Let your left / right wrist extend over the edge of the surface. 4. Use your other hand to lift up your left / right hand toward your forearm. Keep your forearm on the table. 5. Using only the muscles in your left / right hand, slowly lower your hand back down to the starting position. Repeat __________ times. Complete this exercise __________ times a day. This information is not intended to replace advice given to you by your health care provider. Make sure you discuss any questions you have with your health care provider. Document Released: 03/19/2005 Document Revised: 11/23/2015 Document Reviewed: 12/16/2014 Elsevier Interactive Patient Education  2018 [employer]    Google mindful eating and here are some tips and tricks below.   Rate your hunger before you eat on a scale of 1-10, try to eat closer to a 6 or higher. And if you are at below that, why are you eating? Slow down and listen to your body.

## 2018-03-07 LAB — CBC WITH DIFFERENTIAL/PLATELET
BASOS PCT: 0.7 %
Basophils Absolute: 50 cells/uL (ref 0–200)
Eosinophils Absolute: 101 cells/uL (ref 15–500)
Eosinophils Relative: 1.4 %
HCT: 48.2 % — ABNORMAL HIGH (ref 35.0–45.0)
Hemoglobin: 16 g/dL — ABNORMAL HIGH (ref 11.7–15.5)
Lymphs Abs: 2743 cells/uL (ref 850–3900)
MCH: 29 pg (ref 27.0–33.0)
MCHC: 33.2 g/dL (ref 32.0–36.0)
MCV: 87.5 fL (ref 80.0–100.0)
MPV: 10 fL (ref 7.5–12.5)
Monocytes Relative: 6.3 %
NEUTROS PCT: 53.5 %
Neutro Abs: 3852 cells/uL (ref 1500–7800)
Platelets: 312 10*3/uL (ref 140–400)
RBC: 5.51 10*6/uL — AB (ref 3.80–5.10)
RDW: 12.4 % (ref 11.0–15.0)
TOTAL LYMPHOCYTE: 38.1 %
WBC mixed population: 454 cells/uL (ref 200–950)
WBC: 7.2 10*3/uL (ref 3.8–10.8)

## 2018-03-07 LAB — MAGNESIUM: MAGNESIUM: 2.1 mg/dL (ref 1.5–2.5)

## 2018-03-07 LAB — COMPLETE METABOLIC PANEL WITH GFR
AG RATIO: 1.5 (calc) (ref 1.0–2.5)
ALT: 22 U/L (ref 6–29)
AST: 18 U/L (ref 10–35)
Albumin: 4.6 g/dL (ref 3.6–5.1)
Alkaline phosphatase (APISO): 93 U/L (ref 33–130)
BUN: 14 mg/dL (ref 7–25)
CALCIUM: 9.9 mg/dL (ref 8.6–10.4)
CO2: 22 mmol/L (ref 20–32)
Chloride: 104 mmol/L (ref 98–110)
Creat: 0.63 mg/dL (ref 0.50–1.05)
GFR, EST NON AFRICAN AMERICAN: 101 mL/min/{1.73_m2} (ref >=60)
GFR, Est African American: 117 mL/min/{1.73_m2} (ref >=60)
GLUCOSE: 101 mg/dL — AB (ref 65–99)
Globulin: 3 g/dL (calc) (ref 1.9–3.7)
POTASSIUM: 4.7 mmol/L (ref 3.5–5.3)
Sodium: 140 mmol/L (ref 135–146)
TOTAL PROTEIN: 7.6 g/dL (ref 6.1–8.1)
Total Bilirubin: 0.4 mg/dL (ref 0.2–1.2)

## 2018-03-07 LAB — LIPID PANEL
CHOLESTEROL: 261 mg/dL — AB (ref <200)
HDL: 51 mg/dL (ref >50)
LDL CHOLESTEROL (CALC): 170 mg/dL — AB
Non-HDL Cholesterol (Calc): 210 mg/dL (calc) — ABNORMAL HIGH (ref <130)
TRIGLYCERIDES: 234 mg/dL — AB (ref <150)
Total CHOL/HDL Ratio: 5.1 (calc) — ABNORMAL HIGH (ref <5.0)

## 2018-03-07 LAB — HEMOGLOBIN A1C
HEMOGLOBIN A1C: 6.5 %{Hb} — AB (ref <5.7)
MEAN PLASMA GLUCOSE: 140 (calc)
eAG (mmol/L): 7.7 (calc)

## 2018-03-07 LAB — TSH: TSH: 1.95 mIU/L

## 2018-03-10 ENCOUNTER — Encounter: Payer: Self-pay | Admitting: *Deleted

## 2018-03-18 ENCOUNTER — Other Ambulatory Visit: Payer: Self-pay | Admitting: Adult Health

## 2018-06-12 NOTE — Progress Notes (Signed)
FOLLOW UP  Assessment and Plan:   Hypertension -Continue medication, monitor blood pressure at home. Continue DASH diet.  Reminder to go to the ER if any CP, SOB, nausea, dizziness, severe HA, changes vision/speech, left arm numbness and tingling and jaw pain.  Cholesterol -Continue diet and exercise. Check cholesterol.  - may add on zetia - refuses statins   Diabetes with hyperlipidemia -Continue diet and exercise. Check A1C - continue jardiance for now, continue follow up.   Vitamin D Def - check level and continue medications.   Obesity - follow up 3 months for progress monitoring - increase veggies, decrease carbs - long discussion about weight loss, diet, and exercise  Continue diet and meds as discussed. Further disposition pending results of labs. Discussed med's effects and SE's.   Over 30 minutes of exam, counseling, chart review, and critical decision making was performed Future Appointments  Date Time Provider Department Center  12/09/2018  3:00 PM Quentin Mulling, PA-C [facility] None     HPI 58 y.o. female  presents for 3 month follow up on hypertension, cholesterol, diabetes and vitamin D deficiency.   Started new job at [employer].   Her blood pressure has been controlled at home, today their BP is BP: 126/84 BMI is Body mass index is 41.78 kg/m., she is working on diet and exercise. She was unable to tolerate phentermine due to inability to sleep.  Wt Readings from Last 3 Encounters:  06/13/18 243 lb 6.4 oz (110.4 kg)  03/06/18 240 lb (108.9 kg)  01/02/18 246 lb (111.6 kg)     She does not workout. She denies chest pain, shortness of breath, dizziness.   She is not on cholesterol medication and denies myalgias She declines statin use, did start on vascepa last time but was only willing to do 1 g BID at the time. Her cholesterol is not at goal. The cholesterol last visit was:   Lab Results  Component Value Date   CHOL 261 (H) 03/06/2018   HDL 51  03/06/2018   LDLCALC 170 (H) 03/06/2018   TRIG 234 (H) 03/06/2018   CHOLHDL 5.1 (H) 03/06/2018    She has been working on diet and exercise for Diabetes with hyperlipidemia, she is on bASA, she is not on ACE/ARB, she was started on jardiance and is tolerating it well, took a weekend off of it and states she did better on it, no urinary symptoms or vaginal symptoms, and denies paresthesia of the feet, polydipsia, polyuria and visual disturbances. Last A1C was:  Lab Results  Component Value Date   HGBA1C 6.5 (H) 03/06/2018     Lab Results  Component Value Date   GFRNONAA 101 03/06/2018   Patient is on Vitamin D supplement.   Lab Results  Component Value Date   VD25OH 52 07/13/2016         Current Medications:  Current Outpatient Medications on File Prior to Visit  Medication Sig  . aspirin (ASPIRIN EC) 81 MG EC tablet Take 81 mg by mouth daily. Swallow whole.  . cetirizine (ZYRTEC) 10 MG tablet Take 10 mg by mouth daily.  . Coenzyme Q10 (CO Q 10 PO) Take 200 mg by mouth.  . Cyanocobalamin (VITAMIN B12 PO) Take by mouth.  . empagliflozin (JARDIANCE) 25 MG TABS tablet Take 25 mg by mouth daily.  Marland Kitchen escitalopram (LEXAPRO) 20 MG tablet TAKE ONE-HALF TO ONE TABLET BY MOUTH ONCE DAILY FOR MOOD AS DIRECTED  . FIBER PO Take by mouth.  Bess Harvest  Ethyl (VASCEPA) 1 g CAPS Take 1 capsule (1 g total) by mouth 2 (two) times daily.   No current facility-administered medications on file prior to visit.     Medical History:  Past Medical History:  Diagnosis Date  . Allergy   . Anxiety   . History of nephrolithiasis   . Hyperlipidemia   . Hypertension   . Prediabetes    Allergies:  Allergies  Allergen Reactions  . Codeine Nausea Only  . Penicillins      Review of Systems:  ROS  Family history- Review and unchanged Social history- Review and unchanged Physical Exam: BP 126/84   Pulse 70   Temp (!) 97.5 F (36.4 C)   Ht 5\' 4"  (1.626 m)   Wt 243 lb 6.4 oz (110.4 kg)    SpO2 95%   BMI 41.78 kg/m  Wt Readings from Last 3 Encounters:  06/13/18 243 lb 6.4 oz (110.4 kg)  03/06/18 240 lb (108.9 kg)  01/02/18 246 lb (111.6 kg)   General Appearance: Well nourished, in no apparent distress. Eyes: PERRLA, EOMs, conjunctiva no swelling or erythema Sinuses: No Frontal/maxillary tenderness ENT/Mouth: Ext aud canals clear, TMs without erythema, bulging. No erythema, swelling, or exudate on post pharynx.  Tonsils not swollen or erythematous. Hearing normal.  Neck: Supple, thyroid normal.  Respiratory: Respiratory effort normal, BS equal bilaterally without rales, rhonchi, wheezing or stridor.  Cardio: RRR with no MRGs. Brisk peripheral pulses without edema.  Abdomen: Soft, + BS.  Non tender, no guarding, rebound, hernias, masses. Lymphatics: Non tender without lymphadenopathy.  Musculoskeletal: Full ROM, 5/5 strength, Normal gait Skin: Warm, dry without rashes, lesions, ecchymosis.  Neuro: Cranial nerves intact. No cerebellar symptoms.  Psych: Awake and oriented X 3, normal affect, Insight and Judgment appropriate.    Quentin Mulling, PA-C 9:09 AM [facility]

## 2018-06-13 ENCOUNTER — Ambulatory Visit: Payer: 59 | Admitting: Physician Assistant

## 2018-06-13 ENCOUNTER — Other Ambulatory Visit: Payer: Self-pay

## 2018-06-13 VITALS — BP 126/84 | HR 70 | Temp 97.5°F | Ht 64.0 in | Wt 243.4 lb

## 2018-06-13 DIAGNOSIS — E119 Type 2 diabetes mellitus without complications: Secondary | ICD-10-CM

## 2018-06-13 DIAGNOSIS — E1169 Type 2 diabetes mellitus with other specified complication: Secondary | ICD-10-CM | POA: Diagnosis not present

## 2018-06-13 DIAGNOSIS — I10 Essential (primary) hypertension: Secondary | ICD-10-CM

## 2018-06-13 DIAGNOSIS — E785 Hyperlipidemia, unspecified: Secondary | ICD-10-CM

## 2018-06-13 DIAGNOSIS — Z79899 Other long term (current) drug therapy: Secondary | ICD-10-CM

## 2018-06-13 MED ORDER — ICOSAPENT ETHYL 1 G PO CAPS: 2.0000 g | ORAL_CAPSULE | Freq: Two times a day (BID) | ORAL | 3 refills | Status: DC

## 2018-06-13 NOTE — Patient Instructions (Addendum)
Your LDL is not in range or at goal, goal is less than 70.  Your LDL is the bad cholesterol that can lead to heart attack and stroke. To lower your number you can decrease your fatty foods, red meat, cheese, milk and increase fiber like whole grains and veggies. You can also add a fiber supplement like Citracel or Benefiber, these do not cause gas and bloating and are safe to use. Since you have risk factors that make [place] want your number below 70, we need to adjust or add medications to get your number below goal.   Intermittent fasting is more about strategy than starvation. It's meant to reset your body in different ways, hopefully with fitness and nutrition changes as a result.  Like any big switchover, though, results may vary when it comes down to the individual level. What works for your friends may not work for you, or vice versa. That's why it's helpful to play around with variations on intermittent fasting and healthy habits and find what works best for you.  WHAT IS INTERMITTENT FASTING AND WHY DO IT?  Intermittent fasting doesn't involve specific foods, but rather, a strict schedule regarding when you eat. Also called "time-restricted eating," the tactic has been praised for its contribution to weight loss, improved body composition, and decreased cravings. Preliminary research also suggests it may be beneficial for glucose tolerance, hormone regulation, better muscle mass and lower body fat.  Part of its appeal is the simplicity of the effort. Unlike some other trends, there's no calculations to intermittent fasting.  You simply eat within a certain block of time, usually a window of 8-10 hours. In the other big block of time - about 14-16 hours, including when you're asleep - you don't eat anything, not even snacks. You can drink water, coffee, tea or any other beverage that doesn't have calories.  For example, if you like having a late dinner, you might skip breakfast and have your first  meal at noon and your last meal of the day at 8 p.m., and then not eat until noon again the next day.  IDEAS FOR GETTING STARTED  If you're new to the strategy, it may be helpful to eat within the typical circadian rhythm and keep eating within daylight hours. This can be especially beneficial if you're looking at intermittent fasting for weight-loss goals.  So first try only eating between 12pm to 8pm.  Outside of this time you may have water, black coffee, and hot tea. You may not eat it drink anything that has carbs, sugars, OR artificial sugars like diet soda.   Like any major eating and fitness shift, it can take time to find the perfect fit, so don't be afraid to experiment with different options - including ditching intermittent fasting altogether if it's simply not for you. But if it is, you may be surprised by some of the benefits that come along with the strategy.     When it comes to diets, agreement about the perfect plan isn't easy to find, even among the experts. Experts at the [facility] [employer] developed an idea known as the Healthy Eating Plate. Just imagine a plate divided into logical, healthy portions.  The emphasis is on diet quality:  Load up on vegetables and fruits - one-half of your plate: Aim for color and variety, and remember that potatoes don't count.  Go for whole grains - one-quarter of your plate: Whole wheat, barley, wheat berries, quinoa, oats, brown  rice, and foods made with them. If you want pasta, go with whole wheat pasta.  Protein power - one-quarter of your plate: Fish, chicken, beans, and nuts are all healthy, versatile protein sources. Limit red meat.  The diet, however, does go beyond the plate, offering a few other suggestions.  Use healthy plant oils, such as olive, canola, soy, corn, sunflower and peanut. Check the labels, and avoid partially hydrogenated oil, which have unhealthy trans fats.  If you're thirsty, drink water.  Coffee and tea are good in moderation, but skip sugary drinks and limit milk and dairy products to one or two daily servings.  The type of carbohydrate in the diet is more important than the amount. Some sources of carbohydrates, such as vegetables, fruits, whole grains, and beans-are healthier than others.  Finally, stay active.   Drink 1/2 your body weight in fluid ounces of water daily; drink a tall glass of water 30 min before meals  Don't eat until you're stuffed- listen to your stomach and eat until you are 80% full   Try eating off of a salad plate; wait 10 min after finishing before going back for seconds  Start by eating the vegetables on your plate; aim for 34% of your meals to be fruits or vegetables  Then eat your protein - lean meats (grass fed if possible), fish, beans, nuts in moderation  Eat your carbs/starch last ONLY if you still are hungry. If you can, stop before finishing it all  Avoid sugar and flour - the closer it looks to it's original form in nature, typically the better it is for you  Splurge in moderation - "assign" days when you get to splurge and have the "bad stuff" - I like to follow a 80% - 20% plan- "good" choices 80 % of the time, "bad" choices in moderation 20% of the time  Simple equation is: Calories out > calories in = weight loss - even if you eat the bad stuff, if you limit portions, you will still lose weight

## 2018-06-14 LAB — LIPID PANEL
CHOL/HDL RATIO: 5.3 (calc) — AB (ref <5.0)
CHOLESTEROL: 226 mg/dL — AB (ref <200)
HDL: 43 mg/dL — ABNORMAL LOW (ref >=50)
LDL Cholesterol (Calc): 147 mg/dL (calc) — ABNORMAL HIGH
Non-HDL Cholesterol (Calc): 183 mg/dL (calc) — ABNORMAL HIGH (ref <130)
Triglycerides: 225 mg/dL — ABNORMAL HIGH (ref <150)

## 2018-06-14 LAB — COMPLETE METABOLIC PANEL WITH GFR
AG Ratio: 1.5 (calc) (ref 1.0–2.5)
ALKALINE PHOSPHATASE (APISO): 92 U/L (ref 37–153)
ALT: 16 U/L (ref 6–29)
AST: 14 U/L (ref 10–35)
Albumin: 4.1 g/dL (ref 3.6–5.1)
BILIRUBIN TOTAL: 0.3 mg/dL (ref 0.2–1.2)
BUN: 10 mg/dL (ref 7–25)
CO2: 24 mmol/L (ref 20–32)
Calcium: 9.6 mg/dL (ref 8.6–10.4)
Chloride: 105 mmol/L (ref 98–110)
Creat: 0.63 mg/dL (ref 0.50–1.05)
GFR, Est African American: 117 mL/min/{1.73_m2} (ref >=60)
GFR, Est Non African American: 101 mL/min/{1.73_m2} (ref >=60)
Globulin: 2.8 g/dL (calc) (ref 1.9–3.7)
Glucose, Bld: 134 mg/dL — ABNORMAL HIGH (ref 65–99)
Potassium: 4.3 mmol/L (ref 3.5–5.3)
Sodium: 140 mmol/L (ref 135–146)
Total Protein: 6.9 g/dL (ref 6.1–8.1)

## 2018-06-14 LAB — CBC WITH DIFFERENTIAL/PLATELET
Absolute Monocytes: 433 cells/uL (ref 200–950)
Basophils Absolute: 51 cells/uL (ref 0–200)
Basophils Relative: 0.9 %
Eosinophils Absolute: 131 cells/uL (ref 15–500)
Eosinophils Relative: 2.3 %
HCT: 46.4 % — ABNORMAL HIGH (ref 35.0–45.0)
Hemoglobin: 15.5 g/dL (ref 11.7–15.5)
Lymphs Abs: 2252 cells/uL (ref 850–3900)
MCH: 28.9 pg (ref 27.0–33.0)
MCHC: 33.4 g/dL (ref 32.0–36.0)
MCV: 86.4 fL (ref 80.0–100.0)
MPV: 9.8 fL (ref 7.5–12.5)
Monocytes Relative: 7.6 %
Neutro Abs: 2833 cells/uL (ref 1500–7800)
Neutrophils Relative %: 49.7 %
PLATELETS: 296 10*3/uL (ref 140–400)
RBC: 5.37 10*6/uL — ABNORMAL HIGH (ref 3.80–5.10)
RDW: 12.8 % (ref 11.0–15.0)
Total Lymphocyte: 39.5 %
WBC: 5.7 10*3/uL (ref 3.8–10.8)

## 2018-06-14 LAB — HEMOGLOBIN A1C
Hgb A1c MFr Bld: 6.9 % of total Hgb — ABNORMAL HIGH (ref <5.7)
Mean Plasma Glucose: 151 (calc)
eAG (mmol/L): 8.4 (calc)

## 2018-06-14 LAB — MAGNESIUM: Magnesium: 2.1 mg/dL (ref 1.5–2.5)

## 2018-06-14 LAB — TSH: TSH: 2.12 mIU/L

## 2018-06-15 DIAGNOSIS — S52122A Displaced fracture of head of left radius, initial encounter for closed fracture: Secondary | ICD-10-CM | POA: Diagnosis not present

## 2018-06-15 DIAGNOSIS — S52125A Nondisplaced fracture of head of left radius, initial encounter for closed fracture: Secondary | ICD-10-CM | POA: Diagnosis not present

## 2018-06-25 DIAGNOSIS — S52125A Nondisplaced fracture of head of left radius, initial encounter for closed fracture: Secondary | ICD-10-CM | POA: Diagnosis not present

## 2018-06-28 ENCOUNTER — Other Ambulatory Visit: Payer: Self-pay | Admitting: Internal Medicine

## 2018-07-09 DIAGNOSIS — S52125D Nondisplaced fracture of head of left radius, subsequent encounter for closed fracture with routine healing: Secondary | ICD-10-CM | POA: Diagnosis not present

## 2018-07-13 ENCOUNTER — Other Ambulatory Visit: Payer: Self-pay | Admitting: Internal Medicine

## 2018-07-15 ENCOUNTER — Other Ambulatory Visit: Payer: Self-pay

## 2018-07-15 MED ORDER — ESCITALOPRAM OXALATE 20 MG PO TABS: ORAL_TABLET | ORAL | 0 refills | Status: DC

## 2018-07-30 DIAGNOSIS — S52125D Nondisplaced fracture of head of left radius, subsequent encounter for closed fracture with routine healing: Secondary | ICD-10-CM | POA: Diagnosis not present

## 2018-08-21 ENCOUNTER — Encounter: Payer: Self-pay | Admitting: Physician Assistant

## 2018-09-17 ENCOUNTER — Ambulatory Visit: Payer: 59 | Admitting: Physician Assistant

## 2018-10-08 NOTE — Progress Notes (Signed)
FOLLOW UP  Assessment and Plan:   Hypertension -Continue medication, monitor blood pressure at home. Continue DASH diet.  Reminder to go to the ER if any CP, SOB, nausea, dizziness, severe HA, changes vision/speech, left arm numbness and tingling and jaw pain.  Cholesterol -Continue diet and exercise. Check cholesterol.  - may add on zetia - refuses statins, understands risk of MI/stroke   Diabetes with hyperlipidemia -Continue diet and exercise. Check A1C - pending A1C willing to start on ozempic  Vitamin D Def - check level and continue medications.   Obesity - follow up 3 months for progress monitoring - increase veggies, decrease carbs - long discussion about weight loss, diet, and exercise  Continue diet and meds as discussed. Further disposition pending results of labs. Discussed med's effects and SE's.   Over 30 minutes of exam, counseling, chart review, and critical decision making was performed Future Appointments  Date Time Provider Department Center  12/19/2018 10:30 AM Quentin Mullingollier, Hilery Wintle, PA-C [facility] None     HPI 58 y.o. female  presents for 3 month follow up on hypertension, cholesterol, diabetes and vitamin D deficiency.   Started new job at [employer].   Her blood pressure has been controlled at home, today their BP is BP: 128/76 BMI is Body mass index is 43.22 kg/m., she is working on diet and exercise. She was unable to tolerate phentermine due to inability to sleep.  Wt Readings from Last 3 Encounters:  10/09/18 251 lb 12.8 oz (114.2 kg)  06/13/18 243 lb 6.4 oz (110.4 kg)  03/06/18 240 lb (108.9 kg)     She does not workout. She denies chest pain, shortness of breath, dizziness.   She is not on cholesterol medication and denies myalgias She declines statin use, did start on vascepa last time but was only willing to do 1 g BID, states she went up to 4 a day but state she had muscle aches.  Her cholesterol is not at goal. The cholesterol last visit  was:   Lab Results  Component Value Date   CHOL 226 (H) 06/13/2018   HDL 43 (L) 06/13/2018   LDLCALC 147 (H) 06/13/2018   TRIG 225 (H) 06/13/2018   CHOLHDL 5.3 (H) 06/13/2018    She has been working on diet and exercise for  Diabetes with hyperlipidemia  she is on bASA  she is not on ACE/ARB she was started on jardiance but she stopped in June due to the heat and increasing yeast infections denies paresthesia of the feet, polydipsia, polyuria and visual disturbances.  Last A1C was:  Lab Results  Component Value Date   HGBA1C 6.9 (H) 06/13/2018     Lab Results  Component Value Date   GFRNONAA 101 06/13/2018   Patient is on Vitamin D supplement.   Lab Results  Component Value Date   VD25OH 52 07/13/2016         Current Medications:  Current Outpatient Medications on File Prior to Visit  Medication Sig  . aspirin (ASPIRIN EC) 81 MG EC tablet Take 81 mg by mouth daily. Swallow whole.  . cetirizine (ZYRTEC) 10 MG tablet Take 10 mg by mouth daily.  . Coenzyme Q10 (CO Q 10 PO) Take 200 mg by mouth.  . Cyanocobalamin (VITAMIN B12 PO) Take by mouth.  . escitalopram (LEXAPRO) 20 MG tablet Take 1 tablet Daily for Mood  . FIBER PO Take by mouth.  Bess Harvest. Icosapent Ethyl (VASCEPA) 1 g CAPS Take 2 capsules (2 g total) by  mouth 2 (two) times daily.   No current facility-administered medications on file prior to visit.     Medical History:  Past Medical History:  Diagnosis Date  . Allergy   . Anxiety   . History of nephrolithiasis   . Hyperlipidemia   . Hypertension   . Prediabetes    Allergies:  Allergies  Allergen Reactions  . Codeine Nausea Only  . Penicillins      Review of Systems:  Review of Systems  Constitutional: Negative.   HENT: Negative.   Eyes: Negative.   Respiratory: Negative.   Cardiovascular: Negative.   Gastrointestinal: Negative.   Genitourinary: Negative.   Musculoskeletal: Negative.   Skin: Negative.     Family history- Review and  unchanged Social history- Review and unchanged Physical Exam: BP 128/76   Pulse 96   Temp (!) 97.3 F (36.3 C)   Ht 5\' 4"  (1.626 m)   Wt 251 lb 12.8 oz (114.2 kg)   SpO2 96%   BMI 43.22 kg/m  Wt Readings from Last 3 Encounters:  10/09/18 251 lb 12.8 oz (114.2 kg)  06/13/18 243 lb 6.4 oz (110.4 kg)  03/06/18 240 lb (108.9 kg)   General Appearance: Well nourished, in no apparent distress. Eyes: PERRLA, EOMs, conjunctiva no swelling or erythema Sinuses: No Frontal/maxillary tenderness ENT/Mouth: Ext aud canals clear, TMs without erythema, bulging. No erythema, swelling, or exudate on post pharynx.  Tonsils not swollen or erythematous. Hearing normal.  Neck: Supple, thyroid normal.  Respiratory: Respiratory effort normal, BS equal bilaterally without rales, rhonchi, wheezing or stridor.  Cardio: RRR with no MRGs. Brisk peripheral pulses without edema.  Abdomen: Soft, + BS.  Non tender, no guarding, rebound, hernias, masses. Lymphatics: Non tender without lymphadenopathy.  Musculoskeletal: Full ROM, 5/5 strength, Normal gait Skin: Warm, dry without rashes, lesions, ecchymosis.  Neuro: Cranial nerves intact. No cerebellar symptoms.  Psych: Awake and oriented X 3, normal affect, Insight and Judgment appropriate.    Vicie Mutters, PA-C 9:32 AM [facility]

## 2018-10-09 ENCOUNTER — Ambulatory Visit: Payer: 59 | Admitting: Physician Assistant

## 2018-10-09 ENCOUNTER — Other Ambulatory Visit: Payer: Self-pay

## 2018-10-09 ENCOUNTER — Encounter: Payer: Self-pay | Admitting: Physician Assistant

## 2018-10-09 VITALS — BP 128/76 | HR 96 | Temp 97.3°F | Ht 64.0 in | Wt 251.8 lb

## 2018-10-09 DIAGNOSIS — E119 Type 2 diabetes mellitus without complications: Secondary | ICD-10-CM | POA: Diagnosis not present

## 2018-10-09 DIAGNOSIS — Z79899 Other long term (current) drug therapy: Secondary | ICD-10-CM

## 2018-10-09 DIAGNOSIS — I10 Essential (primary) hypertension: Secondary | ICD-10-CM

## 2018-10-09 DIAGNOSIS — E559 Vitamin D deficiency, unspecified: Secondary | ICD-10-CM

## 2018-10-09 DIAGNOSIS — E785 Hyperlipidemia, unspecified: Secondary | ICD-10-CM

## 2018-10-09 DIAGNOSIS — E1169 Type 2 diabetes mellitus with other specified complication: Secondary | ICD-10-CM

## 2018-10-09 NOTE — Patient Instructions (Addendum)
   Start Ozempic injection as shown once a week.   You may inject in the stomach, thigh or arm. You may experience nausea in the first few days which usually goes away.   You will feel fullness of the stomach with starting the medication and should try to keep the portions at meals small.    If any questions or concerns are present call the office  Please check blood sugars at least half the time about 2 hours after any meal and 3 times per week on waking up. Please bring blood sugar monitor to each visit. Recommended blood sugar levels about 2 hours after meal is 140-180 and on waking up 90-130   Check out  Mini habits for weight loss book  2 apps for tracking food is myfitness pal  loseit OR can take picture of your food  I want to challenge you to lose 10 lbs before the next appointment.  That would be about 3 lbs a month!  This is very achievable and I know you can do it.  10 lbs is actually 40 lbs of pressure of your joints, back, hips, knees and ankles.  10lbs may be enough to help your blood pressure and cholesterol for next visit.

## 2018-10-10 LAB — CBC WITH DIFFERENTIAL/PLATELET
Absolute Monocytes: 518 cells/uL (ref 200–950)
Basophils Absolute: 52 cells/uL (ref 0–200)
Basophils Relative: 0.7 %
Eosinophils Absolute: 111 cells/uL (ref 15–500)
Eosinophils Relative: 1.5 %
HCT: 43.7 % (ref 35.0–45.0)
Hemoglobin: 14.6 g/dL (ref 11.7–15.5)
Lymphs Abs: 2464 cells/uL (ref 850–3900)
MCH: 29.2 pg (ref 27.0–33.0)
MCHC: 33.4 g/dL (ref 32.0–36.0)
MCV: 87.4 fL (ref 80.0–100.0)
MPV: 10 fL (ref 7.5–12.5)
Monocytes Relative: 7 %
Neutro Abs: 4255 cells/uL (ref 1500–7800)
Neutrophils Relative %: 57.5 %
Platelets: 279 10*3/uL (ref 140–400)
RBC: 5 10*6/uL (ref 3.80–5.10)
RDW: 12.9 % (ref 11.0–15.0)
Total Lymphocyte: 33.3 %
WBC: 7.4 10*3/uL (ref 3.8–10.8)

## 2018-10-10 LAB — COMPLETE METABOLIC PANEL WITH GFR
AG Ratio: 1.6 (calc) (ref 1.0–2.5)
ALT: 17 U/L (ref 6–29)
AST: 13 U/L (ref 10–35)
Albumin: 3.9 g/dL (ref 3.6–5.1)
Alkaline phosphatase (APISO): 83 U/L (ref 37–153)
BUN: 11 mg/dL (ref 7–25)
CO2: 25 mmol/L (ref 20–32)
Calcium: 9.1 mg/dL (ref 8.6–10.4)
Chloride: 102 mmol/L (ref 98–110)
Creat: 0.6 mg/dL (ref 0.50–1.05)
GFR, Est African American: 118 mL/min/{1.73_m2} (ref >=60)
GFR, Est Non African American: 102 mL/min/{1.73_m2} (ref >=60)
Globulin: 2.4 g/dL (calc) (ref 1.9–3.7)
Glucose, Bld: 173 mg/dL — ABNORMAL HIGH (ref 65–99)
Potassium: 4.3 mmol/L (ref 3.5–5.3)
Sodium: 136 mmol/L (ref 135–146)
Total Bilirubin: 0.4 mg/dL (ref 0.2–1.2)
Total Protein: 6.3 g/dL (ref 6.1–8.1)

## 2018-10-10 LAB — LIPID PANEL
Cholesterol: 222 mg/dL — ABNORMAL HIGH (ref <200)
HDL: 38 mg/dL — ABNORMAL LOW (ref >=50)
LDL Cholesterol (Calc): 140 mg/dL (calc) — ABNORMAL HIGH
Non-HDL Cholesterol (Calc): 184 mg/dL (calc) — ABNORMAL HIGH (ref <130)
Total CHOL/HDL Ratio: 5.8 (calc) — ABNORMAL HIGH (ref <5.0)
Triglycerides: 309 mg/dL — ABNORMAL HIGH (ref <150)

## 2018-10-10 LAB — MAGNESIUM: Magnesium: 1.9 mg/dL (ref 1.5–2.5)

## 2018-10-10 LAB — VITAMIN D 25 HYDROXY (VIT D DEFICIENCY, FRACTURES): Vit D, 25-Hydroxy: 42 ng/mL (ref 30–100)

## 2018-10-10 LAB — TSH: TSH: 1.97 mIU/L (ref 0.40–4.50)

## 2018-10-10 LAB — HEMOGLOBIN A1C
Hgb A1c MFr Bld: 6.7 % of total Hgb — ABNORMAL HIGH (ref <5.7)
Mean Plasma Glucose: 146 (calc)
eAG (mmol/L): 8.1 (calc)

## 2018-10-13 MED ORDER — EZETIMIBE 10 MG PO TABS: 10.0000 mg | ORAL_TABLET | Freq: Every day | ORAL | 1 refills | Status: DC

## 2018-10-13 MED ORDER — OZEMPIC (0.25 OR 0.5 MG/DOSE) 2 MG/1.5ML ~~LOC~~ SOPN: 0.5000 mg | PEN_INJECTOR | SUBCUTANEOUS | 3 refills | Status: DC

## 2018-11-03 ENCOUNTER — Encounter: Payer: Self-pay | Admitting: Adult Health

## 2018-12-09 ENCOUNTER — Encounter: Payer: Self-pay | Admitting: Physician Assistant

## 2018-12-19 ENCOUNTER — Encounter: Payer: Self-pay | Admitting: Physician Assistant

## 2019-01-12 NOTE — Progress Notes (Deleted)
Complete Physical  Assessment and Plan:    Essential hypertension - continue medications, DASH diet, exercise and monitor at home. Call if greater than 130/80.  -     CBC with Differential/Platelet -     COMPLETE METABOLIC PANEL WITH GFR -     TSH -     Urinalysis, Routine w reflex microscopic -     Microalbumin / creatinine urine ratio -     EKG 12-Lead  Hyperlipidemia associated with type 2 diabetes mellitus (HCC) -     Lipid panel - declines statins at this time, will continues to discuss - not at goal  Diabetes mellitus without complication (HCC) -     Hemoglobin A1c - follow up in 1 month -     empagliflozin (JARDIANCE) 25 MG TABS tablet; Take 25 mg by mouth daily.  Environmental allergies Continue meds  Anxiety Continue meds  History of nephrolithiasis Increase water  Severe obesity (BMI >= 40) (HCC) - follow up 3 months for progress monitoring - increase veggies, decrease carbs - long discussion about weight loss, diet, and exercise  Vitamin D deficiency Continue supplement  Medication management -     Magnesium  Screening, anemia, deficiency, iron -     Iron,Total/Total Iron Binding Cap   Continue diet and meds as discussed. Further disposition pending results of labs. Discussed med's effects and SE's.  Future Appointments  Date Time Provider Department Center  01/14/2019 11:00 AM Quentin Mulling, PA-C [facility] None  01/14/2020  2:00 PM Quentin Mulling, PA-C [facility] None     HPI 58 y.o. female  presents for CPE and 3 month follow up with hypertension, hyperlipidemia, diabetes and vitamin D. Her blood pressure has been controlled at home, today their BP is   She does workout. She denies chest pain, shortness of breath, dizziness.  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   BMI is There is no height or weight on file to calculate BMI., she is working on diet and  exercise. Wt Readings from Last 3 Encounters:  10/09/18 251 lb 12.8 oz (114.2 kg)  06/13/18 243 lb 6.4 oz (110.4 kg)  03/06/18 240 lb (108.9 kg)   She is on cholesterol medication and denies myalgias. Her cholesterol is at goal. Lab Results  Component Value Date   CHOL 222 (H) 10/09/2018   HDL 38 (L) 10/09/2018   LDLCALC 140 (H) 10/09/2018   TRIG 309 (H) 10/09/2018   CHOLHDL 5.8 (H) 10/09/2018   She has been working on diet and exercise for Diabetes without complications, not on medication at this time, she is on bASA, does not check sugars at  Home, and denies paresthesia of the feet, polydipsia, polyuria and visual disturbances  Lab Results  Component Value Date   HGBA1C 6.7 (H) 10/09/2018   Lab Results  Component Value Date   GFRNONAA 102 10/09/2018  Patient is on Vitamin D supplement. She is doing well on the lexapro.  She has history of kidney stones.  She is on thyroid medication at this time.  Lab Results  Component Value Date   TSH 1.97 10/09/2018    Current Medications:   Current Outpatient Medications (Endocrine & Metabolic):  .  Semaglutide,0.25 or 0.5MG /DOS, (OZEMPIC, 0.25 OR 0.5 MG/DOSE,) 2 MG/1.5ML SOPN, Inject 0.5 mg into the skin once a week.  Current Outpatient Medications (Cardiovascular):  .  ezetimibe (ZETIA) 10 MG tablet, Take 1 tablet (10 mg total)  by mouth daily. Vanessa Kick Ethyl (VASCEPA) 1 g CAPS, Take 2 capsules (2 g total) by mouth 2 (two) times daily.  Current Outpatient Medications (Respiratory):  .  cetirizine (ZYRTEC) 10 MG tablet, Take 10 mg by mouth daily.  Current Outpatient Medications (Analgesics):  .  aspirin (ASPIRIN EC) 81 MG EC tablet, Take 81 mg by mouth daily. Swallow whole.  Current Outpatient Medications (Hematological):  Marland Kitchen  Cyanocobalamin (VITAMIN B12 PO), Take by mouth.  Current Outpatient Medications (Other):  Marland Kitchen  Coenzyme Q10 (CO Q 10 PO), Take 200 mg by mouth. .  escitalopram (LEXAPRO) 20 MG tablet, Take 1 tablet  Daily for Mood .  FIBER PO, Take by mouth.  Medical History:  Past Medical History:  Diagnosis Date  . Allergy   . Anxiety   . History of nephrolithiasis   . Hyperlipidemia   . Hypertension   . Prediabetes    Preventative Medicine Immunization History  Administered Date(s) Administered  . Influenza Inj Mdck Quad With Preservative 01/02/2018  . Influenza Split 12/01/2013  . Influenza, Seasonal, Injecte, Preservative Fre 03/02/2015  . Pneumococcal Polysaccharide-23 06/09/2013  . Tdap 08/11/2007, 01/02/2018   Tetanus: 2009 due next year Pneumovax: 2015 Prevnar 13: due at age 19 Flu vaccine:DUE Zostavax: N/A  Pap: 2016, never abnormal pap, will get every 5 years 3DMGM: 07/2017 DEXA: N/A Colonoscopy: will schedule EGD: N/A  Allergies Allergies  Allergen Reactions  . Codeine Nausea Only  . Penicillins     SURGICAL HISTORY She  has a past surgical history that includes Tonsillectomy and adenoidectomy and Cesarean section (1995). FAMILY HISTORY Her family history includes Cancer in her father; Hypertension in her father and mother. SOCIAL HISTORY She  reports that she has quit smoking. She has never used smokeless tobacco. She reports that she does not drink alcohol or use drugs.  Review of Systems  Constitutional: Negative.   HENT: Negative.   Eyes: Negative.   Respiratory: Negative.   Cardiovascular: Negative.   Gastrointestinal: Negative.   Genitourinary: Negative.   Musculoskeletal: Negative.   Skin: Negative.   Neurological: Negative.   Endo/Heme/Allergies: Negative.   Psychiatric/Behavioral: Negative.     Physical Exam: There were no vitals taken for this visit. Wt Readings from Last 3 Encounters:  10/09/18 251 lb 12.8 oz (114.2 kg)  06/13/18 243 lb 6.4 oz (110.4 kg)  03/06/18 240 lb (108.9 kg)   General Appearance: Well nourished, in no apparent distress. Eyes: PERRLA, EOMs, conjunctiva no swelling or erythema Sinuses: No Frontal/maxillary  tenderness ENT/Mouth: Ext aud canals clear, TMs without erythema, bulging. No erythema, swelling, or exudate on post pharynx.  Tonsils not swollen or erythematous. Hearing normal.  Neck: Supple, thyroid normal.  Respiratory: Respiratory effort normal, BS equal bilaterally without rales, rhonchi, wheezing or stridor.  Cardio: RRR with no MRGs. Brisk peripheral pulses without edema.  Abdomen: Soft, + BS.  Non tender, no guarding, rebound, hernias, masses. Lymphatics: Non tender without lymphadenopathy.  Musculoskeletal: Full ROM, 5/5 strength, normal gait.  Skin: Warm, dry without rashes, lesions, ecchymosis.  Neuro: Cranial nerves intact. No cerebellar symptoms. Sensation intact.  Psych: Awake and oriented X 3, normal affect, Insight and Judgment appropriate.   EKG WNL, no ST changes  Vicie Mutters, PA-C 12:09 PM [facility]

## 2019-01-14 ENCOUNTER — Encounter: Payer: Self-pay | Admitting: Physician Assistant

## 2019-01-28 ENCOUNTER — Encounter: Payer: 59 | Admitting: Physician Assistant

## 2019-01-28 NOTE — Progress Notes (Signed)
Complete Physical  Assessment and Plan:   Screen for colon cancer -     Ambulatory referral to Gastroenterology  Need for immunization against influenza -     FLU VACCINE MDCK QUAD W/Preservative  Essential hypertension - declines meds, follow up 1 month, may be due to mucinex D  DASH diet, exercise and monitor at home. Call if greater than 130/80.  -     CBC with Differential/Platelet -     COMPLETE METABOLIC PANEL WITH GFR -     TSH -     Urinalysis, Routine w reflex microscopic -     Microalbumin / creatinine urine ratio -     EKG 12-Lead  Hyperlipidemia associated with type 2 diabetes mellitus ([place]) -     Lipid panel - declines statins at this time, will get on zetia- new RX sent in - not at goal  Diabetes mellitus with hyperlipidemia (HCC) -     Hemoglobin A1c - follow up in 1 month- wants to try diet - stop sweet tea, pack good lunch 3 days a week with a serving of veggies.   Environmental allergies Continue meds  Anxiety Continue meds  History of nephrolithiasis Increase water ? Kidney stone now Check urine and follow up with urology  Severe obesity (BMI >= 40) (HCC) - follow up 3 months for progress monitoring - increase veggies, decrease carbs - long discussion about weight loss, diet, and exercise  Vitamin D deficiency Continue supplement  Medication management -     Magnesium   Continue diet and meds as discussed. Further disposition pending results of labs. Discussed med's effects and SE's.  No future appointments.   HPI 58 y.o. female  presents for CPE and 3 month follow up with hypertension, hyperlipidemia, diabetes and vitamin D. Her blood pressure has been controlled at home, today their BP is BP: (!) 156/98, rechecked and got the same. PATIENT IS ON MUCINEX D DUE TO ALLERGIES.   She does workout. She denies chest pain, shortness of breath, dizziness.   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   BMI is Body mass index is 42.79 kg/m., she is working on diet and exercise. Wt Readings from Last 3 Encounters:  01/30/19 253 lb 3.2 oz (114.9 kg)  10/09/18 251 lb 12.8 oz (114.2 kg)  06/13/18 243 lb 6.4 oz (110.4 kg)   She is on cholesterol medication and denies myalgias. Her cholesterol is at goal. Lab Results  Component Value Date   CHOL 222 (H) 10/09/2018   HDL 38 (L) 10/09/2018   LDLCALC 140 (H) 10/09/2018   TRIG 309 (H) 10/09/2018   CHOLHDL 5.8 (H) 10/09/2018   She has been working on diet and exercise for Diabetes  With hyperlipidemia- refuses statins- never started zetia Could not tolerate ozempic, states it gave her left flank pain- has history of kidney stones s/p lithotripsy- has seen Dr. Risa Grill in the past.  Could not tolerate jardiance due to yeast she is on bASA does not check sugars at Home denies paresthesia of the feet, polydipsia, polyuria and visual disturbances  Lab Results  Component Value Date   HGBA1C 6.7 (H) 10/09/2018   Lab Results  Component Value Date   GFRNONAA 102 10/09/2018  Patient is on Vitamin D supplement. She is doing well on the lexapro.    Current Medications:    Current Outpatient Medications (Cardiovascular):  .  ezetimibe (ZETIA) 10 MG tablet, Take 1  tablet (10 mg total) by mouth daily.  Current Outpatient Medications (Respiratory):  .  cetirizine (ZYRTEC) 10 MG tablet, Take 10 mg by mouth daily.  Current Outpatient Medications (Analgesics):  .  aspirin (ASPIRIN EC) 81 MG EC tablet, Take 81 mg by mouth daily. Swallow whole.  Current Outpatient Medications (Hematological):  Marland Kitchen  Cyanocobalamin (VITAMIN B12 PO), Take by mouth.  Current Outpatient Medications (Other):  Marland Kitchen  Cholecalciferol (VITAMIN D) 125 MCG (5000 UT) CAPS, Take by mouth daily. Marland Kitchen  escitalopram (LEXAPRO) 20 MG tablet, Take 1 tablet Daily for Mood .  Coenzyme Q10 (CO Q 10 PO), Take 200 mg by mouth. .  FIBER PO, Take by mouth.  Medical History:   Past Medical History:  Diagnosis Date  . Allergy   . Anxiety   . History of nephrolithiasis   . Hyperlipidemia   . Hypertension   . Prediabetes    Preventative Medicine Immunization History  Administered Date(s) Administered  . Influenza Inj Mdck Quad With Preservative 01/02/2018, 01/30/2019  . Influenza Split 12/01/2013  . Influenza, Seasonal, Injecte, Preservative Fre 03/02/2015  . Pneumococcal Polysaccharide-23 06/09/2013  . Tdap 08/11/2007, 01/02/2018   Tetanus: 2019 year Pneumovax: 2015 Prevnar 13: due at age 41 Flu vaccine:DUE Zostavax: N/A  Pap: 2016, never abnormal pap, will get every 5 years 3DMGM: 01/2018- has scheduled DEXA: N/A Colonoscopy: will schedule EGD: N/A  Allergies Allergies  Allergen Reactions  . Codeine Nausea Only  . Penicillins     SURGICAL HISTORY She  has a past surgical history that includes Tonsillectomy and adenoidectomy and Cesarean section (1995). FAMILY HISTORY Her family history includes Cancer in her father; Hypertension in her father and mother. SOCIAL HISTORY She  reports that she has quit smoking. She has never used smokeless tobacco. She reports that she does not drink alcohol or use drugs.  Review of Systems  Constitutional: Negative.   HENT: Negative.   Eyes: Negative.   Respiratory: Negative.   Cardiovascular: Negative.   Gastrointestinal: Negative.   Genitourinary: Negative.   Musculoskeletal: Negative.   Skin: Negative.   Neurological: Negative.   Endo/Heme/Allergies: Negative.   Psychiatric/Behavioral: Negative.     Physical Exam: BP (!) 156/98   Pulse 68   Temp (!) 97.3 F (36.3 C)   Ht 5' 4.5" (1.638 m)   Wt 253 lb 3.2 oz (114.9 kg)   SpO2 98%   BMI 42.79 kg/m  Wt Readings from Last 3 Encounters:  01/30/19 253 lb 3.2 oz (114.9 kg)  10/09/18 251 lb 12.8 oz (114.2 kg)  06/13/18 243 lb 6.4 oz (110.4 kg)   General Appearance: Well nourished, in no apparent distress. Eyes: PERRLA, EOMs, conjunctiva  no swelling or erythema Sinuses: No Frontal/maxillary tenderness ENT/Mouth: Ext aud canals clear, TMs without erythema, bulging. No erythema, swelling, or exudate on post pharynx.  Tonsils not swollen or erythematous. Hearing normal.  Neck: Supple, thyroid normal.  Respiratory: Respiratory effort normal, BS equal bilaterally without rales, rhonchi, wheezing or stridor.  Cardio: RRR with no MRGs. Brisk peripheral pulses without edema.  Abdomen: Soft, + BS.  Non tender, no guarding, rebound, hernias, masses. Lymphatics: Non tender without lymphadenopathy.  Musculoskeletal: Full ROM, 5/5 strength, normal gait.  Skin: Warm, dry without rashes, lesions, ecchymosis.  Neuro: Cranial nerves intact. No cerebellar symptoms. Sensation intact.  Psych: Awake and oriented X 3, normal affect, Insight and Judgment appropriate.   EKG WNL, no ST changes  Quentin Mulling, PA-C 10:58 AM [facility]

## 2019-01-30 ENCOUNTER — Other Ambulatory Visit: Payer: Self-pay

## 2019-01-30 ENCOUNTER — Ambulatory Visit (INDEPENDENT_AMBULATORY_CARE_PROVIDER_SITE_OTHER): Payer: 59 | Admitting: Physician Assistant

## 2019-01-30 ENCOUNTER — Encounter: Payer: Self-pay | Admitting: Physician Assistant

## 2019-01-30 VITALS — BP 156/98 | HR 68 | Temp 97.3°F | Ht 64.5 in | Wt 253.2 lb

## 2019-01-30 DIAGNOSIS — Z9109 Other allergy status, other than to drugs and biological substances: Secondary | ICD-10-CM

## 2019-01-30 DIAGNOSIS — Z0001 Encounter for general adult medical examination with abnormal findings: Secondary | ICD-10-CM

## 2019-01-30 DIAGNOSIS — Z87442 Personal history of urinary calculi: Secondary | ICD-10-CM

## 2019-01-30 DIAGNOSIS — E785 Hyperlipidemia, unspecified: Secondary | ICD-10-CM | POA: Insufficient documentation

## 2019-01-30 DIAGNOSIS — E1169 Type 2 diabetes mellitus with other specified complication: Secondary | ICD-10-CM

## 2019-01-30 DIAGNOSIS — E559 Vitamin D deficiency, unspecified: Secondary | ICD-10-CM

## 2019-01-30 DIAGNOSIS — F419 Anxiety disorder, unspecified: Secondary | ICD-10-CM

## 2019-01-30 DIAGNOSIS — Z79899 Other long term (current) drug therapy: Secondary | ICD-10-CM

## 2019-01-30 DIAGNOSIS — Z136 Encounter for screening for cardiovascular disorders: Secondary | ICD-10-CM | POA: Diagnosis not present

## 2019-01-30 DIAGNOSIS — Z0000 Encounter for general adult medical examination without abnormal findings: Secondary | ICD-10-CM | POA: Diagnosis not present

## 2019-01-30 DIAGNOSIS — I10 Essential (primary) hypertension: Secondary | ICD-10-CM | POA: Diagnosis not present

## 2019-01-30 DIAGNOSIS — Z1211 Encounter for screening for malignant neoplasm of colon: Secondary | ICD-10-CM

## 2019-01-30 DIAGNOSIS — Z23 Encounter for immunization: Secondary | ICD-10-CM

## 2019-01-30 DIAGNOSIS — Z130 Encounter for screening for diseases of the blood and blood-forming organs and certain disorders involving the immune mechanism: Secondary | ICD-10-CM

## 2019-01-30 MED ORDER — ESCITALOPRAM OXALATE 20 MG PO TABS: ORAL_TABLET | ORAL | 0 refills | Status: DC

## 2019-01-30 MED ORDER — EZETIMIBE 10 MG PO TABS: 10.0000 mg | ORAL_TABLET | Freq: Every day | ORAL | 11 refills | Status: DC

## 2019-01-30 NOTE — Patient Instructions (Addendum)
General eating tips  Stop sweet tea Pack a good lunch- have a serving of veggies at it- 3 days week  Try the zetia 10 mg once a day  What to Avoid . Avoid added sugars o Often added sugar can be found in processed foods such as many condiments, dry cereals, cakes, cookies, chips, crisps, crackers, candies, sweetened drinks, etc.  o Read labels and AVOID/DECREASE use of foods with the following in their ingredient list: Sugar, fructose, high fructose corn syrup, sucrose, glucose, maltose, dextrose, molasses, cane sugar, brown sugar, any type of syrup, agave nectar, etc.   . Avoid snacking in between meals- drink water or if you feel you need a snack, pick a high water content snack such as cucumbers, watermelon, or any veggie.  Marland Kitchen Avoid foods made with flour o If you are going to eat food made with flour, choose those made with whole-grains; and, minimize your consumption as much as is tolerable . Avoid processed foods o These foods are generally stocked in the middle of the grocery store.  o Focus on shopping on the perimeter of the grocery.  What to Include . Vegetables o GREEN LEAFY VEGETABLES: Kale, spinach, mustard greens, collard greens, cabbage, broccoli, etc. o OTHER: Asparagus, cauliflower, eggplant, carrots, peas, Brussel sprouts, tomatoes, bell peppers, zucchini, beets, cucumbers, etc. . Grains, seeds, and legumes o Beans: kidney beans, black eyed peas, garbanzo beans, black beans, pinto beans, etc. o Whole, unrefined grains: brown rice, barley, bulgur, oatmeal, etc. . Healthy fats  o Avoid highly processed fats such as vegetable oil o Examples of healthy fats: avocado, olives, virgin olive oil, dark chocolate (?72% Cocoa), nuts (peanuts, almonds, walnuts, cashews, pecans, etc.) o Please still do small amount of these healthy fats, they are dense in calories.  . Low - Moderate Intake of Animal Sources of Protein o Meat sources: chicken, [place], salmon, tuna. Limit to 4 ounces of  meat at one time or the size of your palm. o Consider limiting dairy sources, but when choosing dairy focus on: PLAIN [place] yogurt, cottage cheese, high-protein milk . Fruit o Choose berries

## 2019-01-31 LAB — COMPLETE METABOLIC PANEL WITH GFR
AG Ratio: 1.7 (calc) (ref 1.0–2.5)
ALT: 19 U/L (ref 6–29)
AST: 15 U/L (ref 10–35)
Albumin: 4.2 g/dL (ref 3.6–5.1)
Alkaline phosphatase (APISO): 99 U/L (ref 37–153)
BUN: 10 mg/dL (ref 7–25)
CO2: 25 mmol/L (ref 20–32)
Calcium: 9.7 mg/dL (ref 8.6–10.4)
Chloride: 103 mmol/L (ref 98–110)
Creat: 0.67 mg/dL (ref 0.50–1.05)
GFR, Est African American: 114 mL/min/{1.73_m2} (ref >=60)
GFR, Est Non African American: 98 mL/min/{1.73_m2} (ref >=60)
Globulin: 2.5 g/dL (calc) (ref 1.9–3.7)
Glucose, Bld: 164 mg/dL — ABNORMAL HIGH (ref 65–99)
Potassium: 4.4 mmol/L (ref 3.5–5.3)
Sodium: 138 mmol/L (ref 135–146)
Total Bilirubin: 0.4 mg/dL (ref 0.2–1.2)
Total Protein: 6.7 g/dL (ref 6.1–8.1)

## 2019-01-31 LAB — CBC WITH DIFFERENTIAL/PLATELET
Absolute Monocytes: 442 cells/uL (ref 200–950)
Basophils Absolute: 53 cells/uL (ref 0–200)
Basophils Relative: 0.8 %
Eosinophils Absolute: 99 cells/uL (ref 15–500)
Eosinophils Relative: 1.5 %
HCT: 45 % (ref 35.0–45.0)
Hemoglobin: 14.8 g/dL (ref 11.7–15.5)
Lymphs Abs: 2633 cells/uL (ref 850–3900)
MCH: 29.3 pg (ref 27.0–33.0)
MCHC: 32.9 g/dL (ref 32.0–36.0)
MCV: 89.1 fL (ref 80.0–100.0)
MPV: 9.7 fL (ref 7.5–12.5)
Monocytes Relative: 6.7 %
Neutro Abs: 3373 cells/uL (ref 1500–7800)
Neutrophils Relative %: 51.1 %
Platelets: 260 10*3/uL (ref 140–400)
RBC: 5.05 10*6/uL (ref 3.80–5.10)
RDW: 12.3 % (ref 11.0–15.0)
Total Lymphocyte: 39.9 %
WBC: 6.6 10*3/uL (ref 3.8–10.8)

## 2019-01-31 LAB — URINALYSIS, ROUTINE W REFLEX MICROSCOPIC
Bilirubin Urine: NEGATIVE
Glucose, UA: NEGATIVE
Hgb urine dipstick: NEGATIVE
Hyaline Cast: NONE SEEN /LPF
Ketones, ur: NEGATIVE
Nitrite: NEGATIVE
Protein, ur: NEGATIVE
Specific Gravity, Urine: 1.023 (ref 1.001–1.03)
pH: <=5 (ref 5.0–8.0)

## 2019-01-31 LAB — MICROALBUMIN / CREATININE URINE RATIO
Creatinine, Urine: 113 mg/dL (ref 20–275)
Microalb Creat Ratio: 18 mcg/mg creat (ref <30)
Microalb, Ur: 2 mg/dL

## 2019-01-31 LAB — LIPID PANEL
Cholesterol: 236 mg/dL — ABNORMAL HIGH (ref <200)
HDL: 44 mg/dL — ABNORMAL LOW (ref >=50)
LDL Cholesterol (Calc): 152 mg/dL (calc) — ABNORMAL HIGH
Non-HDL Cholesterol (Calc): 192 mg/dL (calc) — ABNORMAL HIGH (ref <130)
Total CHOL/HDL Ratio: 5.4 (calc) — ABNORMAL HIGH (ref <5.0)
Triglycerides: 235 mg/dL — ABNORMAL HIGH (ref <150)

## 2019-01-31 LAB — VITAMIN D 25 HYDROXY (VIT D DEFICIENCY, FRACTURES): Vit D, 25-Hydroxy: 55 ng/mL (ref 30–100)

## 2019-01-31 LAB — HEMOGLOBIN A1C
Hgb A1c MFr Bld: 7.2 % of total Hgb — ABNORMAL HIGH (ref <5.7)
Mean Plasma Glucose: 160 (calc)
eAG (mmol/L): 8.9 (calc)

## 2019-01-31 LAB — TSH: TSH: 2.18 mIU/L (ref 0.40–4.50)

## 2019-01-31 LAB — MAGNESIUM: Magnesium: 1.9 mg/dL (ref 1.5–2.5)

## 2019-02-16 ENCOUNTER — Encounter: Payer: Self-pay | Admitting: Internal Medicine

## 2019-02-19 LAB — HM MAMMOGRAPHY

## 2019-03-09 NOTE — Progress Notes (Signed)
Assessment and Plan:  Type 2 diabetes mellitus with hyperlipidemia (HCC) -   Given samples of the 3 mg, and RX for the Semaglutide (RYBELSUS) 7 MG TABS; Take 7 mg by mouth every morning. Discussed disease progression and risks Discussed diet/exercise, weight management and risk modification  Morbid obesity (HCC) -     Semaglutide (RYBELSUS) 7 MG TABS; Take 7 mg by mouth every morning. - follow up 3 months for progress monitoring - increase veggies, decrease carbs - long discussion about weight loss, diet, and exercise   Future Appointments  Date Time Provider Department Center  05/11/2019  8:45 AM Quentin Mulling, PA-C [facility] None  02/08/2020  9:00 AM Quentin Mulling, PA-C [facility] None    HPI 58 y.o.female presents for 1 month follow up for weight and blood pressure from CPE.   At her CPE, her BP was elevated however she was taking mucinex D so she is here for a recheck off OTC meds.  BP Readings from Last 3 Encounters:  03/11/19 136/80  01/30/19 (!) 156/98  10/09/18 128/76   BMI is Body mass index is 42.59 kg/m. She has morbid obesity with DM and hyperlipidemia. She has stopped sweet tea, pack good lunch 3 days a week with a serving of veggies. She was also started on zetia last OV, declines statins, has not tried.  She has been on jardiance in the past for DM but was having yeast infections with it. Could not tolerate ozempic, states it gave her flank pain.  Wt Readings from Last 3 Encounters:  03/11/19 252 lb (114.3 kg)  01/30/19 253 lb 3.2 oz (114.9 kg)  10/09/18 251 lb 12.8 oz (114.2 kg)   She is on the zetia daily and tolerating.  Lab Results  Component Value Date   CHOL 236 (H) 01/30/2019   HDL 44 (L) 01/30/2019   LDLCALC 152 (H) 01/30/2019   TRIG 235 (H) 01/30/2019   CHOLHDL 5.4 (H) 01/30/2019   Lab Results  Component Value Date   HGBA1C 7.2 (H) 01/30/2019      Patient Active Problem List   Diagnosis Date Noted  . Type 2 diabetes mellitus with  hyperlipidemia (HCC) 01/30/2019  . Vitamin D deficiency 09/16/2014  . Medication management 09/16/2014  . Severe obesity (BMI >= 40) (HCC) 06/09/2013  . Hypertension   . Environmental allergies   . Anxiety   . Hyperlipidemia associated with type 2 diabetes mellitus (HCC)   . Diabetes mellitus without complication (HCC)   . History of nephrolithiasis       Current Outpatient Medications (Cardiovascular):  .  ezetimibe (ZETIA) 10 MG tablet, Take 1 tablet (10 mg total) by mouth daily.  Current Outpatient Medications (Respiratory):  .  cetirizine (ZYRTEC) 10 MG tablet, Take 10 mg by mouth daily.  Current Outpatient Medications (Analgesics):  .  aspirin (ASPIRIN EC) 81 MG EC tablet, Take 81 mg by mouth daily. Swallow whole.  Current Outpatient Medications (Hematological):  Marland Kitchen  Cyanocobalamin (VITAMIN B12 PO), Take by mouth.  Current Outpatient Medications (Other):  Marland Kitchen  Cholecalciferol (VITAMIN D) 125 MCG (5000 UT) CAPS, Take by mouth daily. Marland Kitchen  escitalopram (LEXAPRO) 20 MG tablet, Take 1 tablet Daily for Mood .  FIBER PO, Take by mouth.  Allergies  Allergen Reactions  . Codeine Nausea Only  . Penicillins     ROS: all negative except above.   Physical Exam: Filed Weights   03/11/19 0836  Weight: 252 lb (114.3 kg)   BP 136/80   Pulse 67  Temp 98.1 F (36.7 C)   Wt 252 lb (114.3 kg)   SpO2 98%   BMI 42.59 kg/m  General Appearance: Well nourished, in no apparent distress. Eyes: PERRLA, EOMs, conjunctiva no swelling or erythema Sinuses: No Frontal/maxillary tenderness ENT/Mouth: Ext aud canals clear, TMs without erythema, bulging. No erythema, swelling, or exudate on post pharynx.  Tonsils not swollen or erythematous. Hearing normal.  Neck: Supple, thyroid normal.  Respiratory: Respiratory effort normal, BS equal bilaterally without rales, rhonchi, wheezing or stridor.  Cardio: RRR with no MRGs. Brisk peripheral pulses without edema.  Abdomen: Soft, + BS.  Non tender,  no guarding, rebound, hernias, masses. Lymphatics: Non tender without lymphadenopathy.  Musculoskeletal: Full ROM, 5/5 strength, normal gait.  Skin: Warm, dry without rashes, lesions, ecchymosis.  Neuro: Cranial nerves intact. Normal muscle tone, no cerebellar symptoms. Sensation intact.  Psych: Awake and oriented X 3, normal affect, Insight and Judgment appropriate.     Vicie Mutters, PA-C 8:43 AM [facility]'

## 2019-03-11 ENCOUNTER — Encounter: Payer: Self-pay | Admitting: Physician Assistant

## 2019-03-11 ENCOUNTER — Ambulatory Visit (INDEPENDENT_AMBULATORY_CARE_PROVIDER_SITE_OTHER): Payer: 59 | Admitting: Physician Assistant

## 2019-03-11 ENCOUNTER — Other Ambulatory Visit: Payer: Self-pay

## 2019-03-11 VITALS — BP 136/80 | HR 67 | Temp 98.1°F | Wt 252.0 lb

## 2019-03-11 DIAGNOSIS — E1169 Type 2 diabetes mellitus with other specified complication: Secondary | ICD-10-CM | POA: Diagnosis not present

## 2019-03-11 DIAGNOSIS — E785 Hyperlipidemia, unspecified: Secondary | ICD-10-CM

## 2019-03-11 MED ORDER — RYBELSUS 7 MG PO TABS: 7.0000 mg | ORAL_TABLET | ORAL | 2 refills | Status: DC

## 2019-03-11 NOTE — Patient Instructions (Addendum)
De Quervain's Tenosynovitis  De Quervain's tenosynovitis is a condition that causes inflammation of the tendon on the thumb side of the wrist. Tendons are cords of tissue that connect bones to muscles. The tendons in the hand pass through a tunnel called a sheath. A slippery layer of tissue (synovium) lets the tendons move smoothly in the sheath. With de Quervain's tenosynovitis, the sheath swells or thickens, causing friction and pain. The condition is also called de Quervain's disease and de Quervain's syndrome. It occurs most often in women who are 30-50 years old. What are the causes? The exact cause of this condition is not known. It may be associated with overuse of the hand and wrist. What increases the risk? You are more likely to develop this condition if you:  Use your hands far more than normal, especially if you repeat certain movements that involve twisting your hand or using a tight grip.  Are pregnant.  Are a middle-aged woman.  Have rheumatoid arthritis.  Have diabetes. What are the signs or symptoms? The main symptom of this condition is pain on the thumb side of the wrist. The pain may get worse when you grasp something or turn your wrist. Other symptoms may include:  Pain that extends up the forearm.  Swelling of your wrist and hand.  Trouble moving the thumb and wrist.  A sensation of snapping in the wrist.  A bump filled with fluid (cyst) in the area of the pain. How is this diagnosed? This condition may be diagnosed based on:  Your symptoms and medical history.  A physical exam. During the exam, your health care provider may do a simple test (Finkelstein test) that involves pulling your thumb and wrist to see if this causes pain. You may also need to have an X-ray. How is this treated? Treatment for this condition may include:  Avoiding any activity that causes pain and swelling.  Taking medicines. Anti-inflammatory medicines and corticosteroid  injections may be used to reduce inflammation and relieve pain.  Wearing a splint.  Having surgery. This may be needed if other treatments do not work. Once the pain and swelling has gone down:  Physical therapy. This includes stretching and strengthening exercises.  Occupational therapy. This includes adjusting how you move your wrist. Follow these instructions at home: If you have a splint:  Wear the splint as told by your health care provider. Remove it only as told by your health care provider.  Loosen the splint if your fingers tingle, become numb, or turn cold and blue.  Keep the splint clean.  If the splint is not waterproof: ? Do not let it get wet. ? Cover it with a watertight covering when you take a bath or a shower. Managing pain, stiffness, and swelling   Avoid movements and activities that cause pain and swelling in the wrist area.  If directed, put ice on the painful area. This may be helpful after doing activities that involve the sore wrist. ? Put ice in a plastic bag. ? Place a towel between your skin and the bag. ? Leave the ice on for 20 minutes, 2-3 times a day.  Move your fingers often to avoid stiffness and to lessen swelling.  Raise (elevate) the injured area above the level of your heart while you are sitting or lying down. General instructions  Return to your normal activities as told by your health care provider. Ask your health care provider what activities are safe for you.  Take over-the-counter   and prescription medicines only as told by your health care provider.  Keep all follow-up visits as told by your health care provider. This is important. Contact a health care provider if:  Your pain medicine does not help.  Your pain gets worse.  You develop new symptoms. Summary  De Quervain's tenosynovitis is a condition that causes inflammation of the tendon on the thumb side of the wrist.  The condition occurs most often in women who are  30-50 years old.  The exact cause of this condition is not known. It may be associated with overuse of the hand and wrist.  Treatment starts with avoiding activity that causes pain or swelling in the wrist area. Other treatment may include wearing a splint and taking medicine. Sometimes, surgery is needed. This information is not intended to replace advice given to you by your health care provider. Make sure you discuss any questions you have with your health care provider. Document Released: 12/12/2000 Document Revised: 09/19/2017 Document Reviewed: 02/25/2017 Elsevier Patient Education  2020 Elsevier Inc.  

## 2019-03-19 ENCOUNTER — Encounter: Payer: Self-pay | Admitting: *Deleted

## 2019-05-11 ENCOUNTER — Encounter: Payer: Self-pay | Admitting: Physician Assistant

## 2019-05-11 ENCOUNTER — Other Ambulatory Visit: Payer: Self-pay

## 2019-05-11 ENCOUNTER — Ambulatory Visit (INDEPENDENT_AMBULATORY_CARE_PROVIDER_SITE_OTHER): Payer: 59 | Admitting: Physician Assistant

## 2019-05-11 VITALS — BP 158/90 | HR 74 | Temp 97.7°F | Resp 12 | Ht 64.0 in | Wt 251.8 lb

## 2019-05-11 DIAGNOSIS — E785 Hyperlipidemia, unspecified: Secondary | ICD-10-CM

## 2019-05-11 DIAGNOSIS — E1169 Type 2 diabetes mellitus with other specified complication: Secondary | ICD-10-CM | POA: Diagnosis not present

## 2019-05-11 DIAGNOSIS — Z79899 Other long term (current) drug therapy: Secondary | ICD-10-CM

## 2019-05-11 DIAGNOSIS — Z1389 Encounter for screening for other disorder: Secondary | ICD-10-CM

## 2019-05-11 DIAGNOSIS — I10 Essential (primary) hypertension: Secondary | ICD-10-CM

## 2019-05-11 DIAGNOSIS — E559 Vitamin D deficiency, unspecified: Secondary | ICD-10-CM

## 2019-05-11 NOTE — Progress Notes (Signed)
FOLLOW UP  Assessment and Plan:   Hypertension -patient declines medication, discussed if any changes in kidney function or protein will start on ARB, otherwise will do DASH diet/exercise and monitor blood pressure at home.  Reminder to go to the ER if any CP, SOB, nausea, dizziness, severe HA, changes vision/speech, left arm numbness and tingling and jaw pain.  Cholesterol -Continue diet and exercise. Check cholesterol.  Continue zetia - refuses statins, understands risk of MI/stroke   Diabetes with hyperlipidemia -Continue diet and exercise. Check A1C -start on rybelsus  Vitamin D Def - check level and continue medications.   Obesity - follow up 3 months for progress monitoring - increase veggies, decrease carbs - long discussion about weight loss, diet, and exercise  Continue diet and meds as discussed. Further disposition pending results of labs. Discussed med's effects and SE's.   Over 30 minutes of exam, counseling, chart review, and critical decision making was performed Future Appointments  Date Time Provider [place]  02/08/2020  9:00 AM Vicie Mutters, PA-C [facility] None     HPI 59 y.o. female  presents for 3 month follow up on hypertension, cholesterol, diabetes and vitamin D deficiency.   Started new job at [employer].   Her blood pressure has NOT been controlled at home, today their BP is BP: (!) 158/90   BMI is Body mass index is 43.22 kg/m., she is working on diet and exercise. She was unable to tolerate phentermine due to inability to sleep.  Wt Readings from Last 3 Encounters:  05/11/19 251 lb 12.8 oz (114.2 kg)  03/11/19 252 lb (114.3 kg)  01/30/19 253 lb 3.2 oz (114.9 kg)    She does not workout. She denies chest pain, shortness of breath, dizziness.   She is not on cholesterol medication and denies myalgias She declines statin use, started on zetia last visit, no issues, could not tolerate vasepa due to muscle aches.   Her cholesterol  is not at goal. The cholesterol last visit was:   Lab Results  Component Value Date   CHOL 236 (H) 01/30/2019   HDL 44 (L) 01/30/2019   LDLCALC 152 (H) 01/30/2019   TRIG 235 (H) 01/30/2019   CHOLHDL 5.4 (H) 01/30/2019    She has been working on diet and exercise for  Diabetes with hyperlipidemia NOT at goal, not on statin, now on zeteia  she is on bASA  she is not on ACE/ARB she was started on jardiance but she stopped in June due to the heat and increasing yeast infections Given Rybelsus last visit but she did not start, has 7 mg, did not want to do injectable denies paresthesia of the feet, polydipsia, polyuria and visual disturbances.  Last A1C was:  Lab Results  Component Value Date   HGBA1C 7.2 (H) 01/30/2019     Lab Results  Component Value Date   GFRNONAA 98 01/30/2019   Patient is on Vitamin D supplement.   Lab Results  Component Value Date   VD25OH 55 01/30/2019         Current Medications:  Current Outpatient Medications on File Prior to Visit  Medication Sig  . aspirin (ASPIRIN EC) 81 MG EC tablet Take 81 mg by mouth daily. Swallow whole.  . cetirizine (ZYRTEC) 10 MG tablet Take 10 mg by mouth daily.  . Cholecalciferol (VITAMIN D) 125 MCG (5000 UT) CAPS Take by mouth daily.  . Cyanocobalamin (VITAMIN B12 PO) Take by mouth.  . escitalopram (LEXAPRO) 20 MG tablet  Take 1 tablet Daily for Mood  . ezetimibe (ZETIA) 10 MG tablet Take 1 tablet (10 mg total) by mouth daily.  Marland Kitchen FIBER PO Take by mouth.  . Semaglutide (RYBELSUS) 7 MG TABS Take 7 mg by mouth every morning. (Patient not taking: Reported on 05/11/2019)   No current facility-administered medications on file prior to visit.    Medical History:  Past Medical History:  Diagnosis Date  . Allergy   . Anxiety   . History of nephrolithiasis   . Hyperlipidemia   . Hypertension   . Prediabetes    Allergies:  Allergies  Allergen Reactions  . Codeine Nausea Only  . Penicillins      Review of  Systems:  Review of Systems  Constitutional: Negative.   HENT: Negative.   Eyes: Negative.   Respiratory: Negative.   Cardiovascular: Negative.   Gastrointestinal: Negative.   Genitourinary: Negative.   Musculoskeletal: Negative.   Skin: Negative.     Family history- Review and unchanged Social history- Review and unchanged Physical Exam: BP (!) 158/90   Pulse 74   Temp 97.7 F (36.5 C) (Temporal)   Resp 12   Ht 5\' 4"  (1.626 m)   Wt 251 lb 12.8 oz (114.2 kg)   SpO2 98%   BMI 43.22 kg/m  Wt Readings from Last 3 Encounters:  05/11/19 251 lb 12.8 oz (114.2 kg)  03/11/19 252 lb (114.3 kg)  01/30/19 253 lb 3.2 oz (114.9 kg)   General Appearance: Well nourished, in no apparent distress. Eyes: PERRLA, EOMs, conjunctiva no swelling or erythema Sinuses: No Frontal/maxillary tenderness ENT/Mouth: Ext aud canals clear, TMs without erythema, bulging. No erythema, swelling, or exudate on post pharynx.  Tonsils not swollen or erythematous. Hearing normal.  Neck: Supple, thyroid normal.  Respiratory: Respiratory effort normal, BS equal bilaterally without rales, rhonchi, wheezing or stridor.  Cardio: RRR with systolic murmur. Brisk peripheral pulses without edema.  Abdomen: Soft, + BS.  Non tender, no guarding, rebound, hernias, masses. Lymphatics: Non tender without lymphadenopathy.  Musculoskeletal: Full ROM, 5/5 strength, Normal gait Skin: Warm, dry without rashes, lesions, ecchymosis.  Neuro: Cranial nerves intact. No cerebellar symptoms.  Psych: Awake and oriented X 3, normal affect, Insight and Judgment appropriate.    02/01/19, PA-C 8:55 AM [facility]

## 2019-05-11 NOTE — Patient Instructions (Addendum)
We will start you on Rybelsus You have to take 3 mg for 1 month Take it with water only in the morning 30 mins before food  It should NOT be run through your insurance, get the pharmacy to run it with the savings card that you can get from one of these areas below  Text READY to [phone] to get a copay savings and text message to help you start RYBELSUS  Or visit [email or web address] to download a Savings Card   HYPERTENSION INFORMATION  Monitor your blood pressure at home, please keep a record and bring that in with you to your next office visit.   Go to the ER if any CP, SOB, nausea, dizziness, severe HA, changes vision/speech  Testing/Procedures: HOW TO TAKE YOUR BLOOD PRESSURE:  Rest 5 minutes before taking your blood pressure.  Don't smoke or drink caffeinated beverages for at least 30 minutes before.  Take your blood pressure before (not after) you eat.  Sit comfortably with your back supported and both feet on the floor (don't cross your legs).  Elevate your arm to heart level on a table or a desk.  Use the proper sized cuff. It should fit smoothly and snugly around your bare upper arm. There should be enough room to slip a fingertip under the cuff. The bottom edge of the cuff should be 1 inch above the crease of the elbow.  Due to a recent study, SPRINT, we have changed our goal for the systolic or top blood pressure number. Ideally we want your top number at 120.  In the [facility] Trial, 5000 people were randomized to a goal BP of 120 and 5000 people were randomized to a goal BP of less than 140. The patients with the goal BP at 120 had LESS DEMENTIA, LESS HEART ATTACKS, AND LESS STROKES, AS WELL AS OVERALL DECREASED MORTALITY OR DEATH RATE.   There was another study that showed taking your blood pressure medications at night decrease cardiovascular events.  However if you are on a fluid pill, please take this in the morning.   If you are willing, our goal BP is the top number  of 120.  Your most recent BP: BP: (!) 158/90   Take your medications faithfully as instructed. Maintain a healthy weight. Get at least 150 minutes of aerobic exercise per week. Minimize salt intake. Minimize alcohol intake  DASH Eating Plan DASH stands for "Dietary Approaches to Stop Hypertension." The DASH eating plan is a healthy eating plan that has been shown to reduce high blood pressure (hypertension). Additional health benefits may include reducing the risk of type 2 diabetes mellitus, heart disease, and stroke. The DASH eating plan may also help with weight loss. WHAT DO I NEED TO KNOW ABOUT THE DASH EATING PLAN? For the DASH eating plan, you will follow these general guidelines:  Choose foods with a percent daily value for sodium of less than 5% (as listed on the food label).  Use salt-free seasonings or herbs instead of table salt or sea salt.  Check with your health care provider or pharmacist before using salt substitutes.  Eat lower-sodium products, often labeled as "lower sodium" or "no salt added."  Eat fresh foods.  Eat more vegetables, fruits, and low-fat dairy products.  Choose whole grains. Look for the word "whole" as the first word in the ingredient list.  Choose fish and skinless chicken or [place] more often than red meat. Limit fish, poultry, and meat to 6 oz (170 g) each  day.  Limit sweets, desserts, sugars, and sugary drinks.  Choose heart-healthy fats.  Limit cheese to 1 oz (28 g) per day.  Eat more home-cooked food and less restaurant, buffet, and fast food.  Limit fried foods.  Cook foods using methods other than frying.  Limit canned vegetables. If you do use them, rinse them well to decrease the sodium.  When eating at a restaurant, ask that your food be prepared with less salt, or no salt if possible. WHAT FOODS CAN I EAT? Seek help from a dietitian for individual calorie needs. Grains Whole grain or whole wheat bread. Brown rice. Whole  grain or whole wheat pasta. Quinoa, bulgur, and whole grain cereals. Low-sodium cereals. Corn or whole wheat flour tortillas. Whole grain cornbread. Whole grain crackers. Low-sodium crackers. Vegetables Fresh or frozen vegetables (raw, steamed, roasted, or grilled). Low-sodium or reduced-sodium tomato and vegetable juices. Low-sodium or reduced-sodium tomato sauce and paste. Low-sodium or reduced-sodium canned vegetables.  Fruits All fresh, canned (in natural juice), or frozen fruits. Meat and Other Protein Products Ground beef (85% or leaner), grass-fed beef, or beef trimmed of fat. Skinless chicken or [place]. Ground chicken or [place]. Pork trimmed of fat. All fish and seafood. Eggs. Dried beans, peas, or lentils. Unsalted nuts and seeds. Unsalted canned beans. Dairy Low-fat dairy products, such as skim or 1% milk, 2% or reduced-fat cheeses, low-fat ricotta or cottage cheese, or plain low-fat yogurt. Low-sodium or reduced-sodium cheeses. Fats and Oils Tub margarines without trans fats. Light or reduced-fat mayonnaise and salad dressings (reduced sodium). Avocado. Safflower, olive, or canola oils. Natural peanut or almond butter. Other Unsalted popcorn and pretzels. The items listed above may not be a complete list of recommended foods or beverages. Contact your dietitian for more options. WHAT FOODS ARE NOT RECOMMENDED? Grains White bread. White pasta. White rice. Refined cornbread. Bagels and croissants. Crackers that contain trans fat. Vegetables Creamed or fried vegetables. Vegetables in a cheese sauce. Regular canned vegetables. Regular canned tomato sauce and paste. Regular tomato and vegetable juices. Fruits Dried fruits. Canned fruit in light or heavy syrup. Fruit juice. Meat and Other Protein Products Fatty cuts of meat. Ribs, chicken wings, bacon, sausage, bologna, salami, chitterlings, fatback, hot dogs, bratwurst, and packaged luncheon meats. Salted nuts and seeds. Canned beans with  salt. Dairy Whole or 2% milk, cream, half-and-half, and cream cheese. Whole-fat or sweetened yogurt. Full-fat cheeses or blue cheese. Nondairy creamers and whipped toppings. Processed cheese, cheese spreads, or cheese curds. Condiments Onion and garlic salt, seasoned salt, table salt, and sea salt. Canned and packaged gravies. Worcestershire sauce. Tartar sauce. Barbecue sauce. Teriyaki sauce. Soy sauce, including reduced sodium. Steak sauce. Fish sauce. Oyster sauce. Cocktail sauce. Horseradish. Ketchup and mustard. Meat flavorings and tenderizers. Bouillon cubes. Hot sauce. Tabasco sauce. Marinades. Taco seasonings. Relishes. Fats and Oils Butter, stick margarine, lard, shortening, ghee, and bacon fat. Coconut, palm kernel, or palm oils. Regular salad dressings. Other Pickles and olives. Salted popcorn and pretzels. The items listed above may not be a complete list of foods and beverages to avoid. Contact your dietitian for more information. WHERE CAN I FIND MORE INFORMATION? National Heart, Lung, and Blood Institute: travelstabloid.com Document Released: 03/08/2011 Document Revised: 08/03/2013 Document Reviewed: 01/21/2013 [facility] [place] Patient Information 2015 [place], [place]. This information is not intended to replace advice given to you by your health care provider. Make sure you discuss any questions you have with your health care provider.  RANGE OF A1C   Your A1C is a measure of  your sugar over the past 3 months and is not affected by what you have eaten over the past few days. Diabetes increases your chances of stroke and heart attack over 300 % and is the leading cause of blindness and kidney failure in the [place]. Please make sure you decrease bad carbs like white bread, white rice, potatoes, corn, soft drinks, pasta, cereals, refined sugars, sweet tea, dried fruits, and fruit juice. Good carbs are okay to eat in moderation like sweet potatoes, brown  rice, whole grain pasta/bread, most fruit (except dried fruit) and you can eat as many veggies as you want.   Greater than 6.5 is considered diabetic. Between 6.4 and 5.7 is prediabetic If your A1C is less than 5.7 you are NOT diabetic.  Targets for Glucose Readings: Time of Check Target for patients WITHOUT Diabetes Target for DIABETICS  Before Meals Less than 100  less than 150  Two hours after meals Less than 200  Less than 250   General eating tips  What to Avoid . Avoid added sugars o Often added sugar can be found in processed foods such as many condiments, dry cereals, cakes, cookies, chips, crisps, crackers, candies, sweetened drinks, etc.  o Read labels and AVOID/DECREASE use of foods with the following in their ingredient list: Sugar, fructose, high fructose corn syrup, sucrose, glucose, maltose, dextrose, molasses, cane sugar, brown sugar, any type of syrup, agave nectar, etc.   . Avoid snacking in between meals- drink water or if you feel you need a snack, pick a high water content snack such as cucumbers, watermelon, or any veggie.  Marland Kitchen Avoid foods made with flour o If you are going to eat food made with flour, choose those made with whole-grains; and, minimize your consumption as much as is tolerable . Avoid processed foods o These foods are generally stocked in the middle of the grocery store.  o Focus on shopping on the perimeter of the grocery.  What to Include . Vegetables o GREEN LEAFY VEGETABLES: Kale, spinach, mustard greens, collard greens, cabbage, broccoli, etc. o OTHER: Asparagus, cauliflower, eggplant, carrots, peas, Brussel sprouts, tomatoes, bell peppers, zucchini, beets, cucumbers, etc. . Grains, seeds, and legumes o Beans: kidney beans, black eyed peas, garbanzo beans, black beans, pinto beans, etc. o Whole, unrefined grains: brown rice, barley, bulgur, oatmeal, etc. . Healthy fats  o Avoid highly processed fats such as vegetable oil o Examples of healthy  fats: avocado, olives, virgin olive oil, dark chocolate (?72% Cocoa), nuts (peanuts, almonds, walnuts, cashews, pecans, etc.) o Please still do small amount of these healthy fats, they are dense in calories.  . Low - Moderate Intake of Animal Sources of Protein o Meat sources: chicken, [place], salmon, tuna. Limit to 4 ounces of meat at one time or the size of your palm. o Consider limiting dairy sources, but when choosing dairy focus on: PLAIN [place] yogurt, cottage cheese, high-protein milk . Fruit o Choose berries

## 2019-05-12 LAB — LIPID PANEL
Cholesterol: 192 mg/dL (ref <200)
HDL: 52 mg/dL (ref >=50)
LDL Cholesterol (Calc): 110 mg/dL (calc) — ABNORMAL HIGH
Non-HDL Cholesterol (Calc): 140 mg/dL (calc) — ABNORMAL HIGH (ref <130)
Total CHOL/HDL Ratio: 3.7 (calc) (ref <5.0)
Triglycerides: 180 mg/dL — ABNORMAL HIGH (ref <150)

## 2019-05-12 LAB — MICROALBUMIN / CREATININE URINE RATIO
Creatinine, Urine: 101 mg/dL (ref 20–275)
Microalb Creat Ratio: 18 mcg/mg creat (ref <30)
Microalb, Ur: 1.8 mg/dL

## 2019-05-12 LAB — CBC WITH DIFFERENTIAL/PLATELET
Absolute Monocytes: 435 cells/uL (ref 200–950)
Basophils Absolute: 38 cells/uL (ref 0–200)
Basophils Relative: 0.6 %
Eosinophils Absolute: 82 cells/uL (ref 15–500)
Eosinophils Relative: 1.3 %
HCT: 45.9 % — ABNORMAL HIGH (ref 35.0–45.0)
Hemoglobin: 15.3 g/dL (ref 11.7–15.5)
Lymphs Abs: 2325 cells/uL (ref 850–3900)
MCH: 29.5 pg (ref 27.0–33.0)
MCHC: 33.3 g/dL (ref 32.0–36.0)
MCV: 88.6 fL (ref 80.0–100.0)
MPV: 9.7 fL (ref 7.5–12.5)
Monocytes Relative: 6.9 %
Neutro Abs: 3421 cells/uL (ref 1500–7800)
Neutrophils Relative %: 54.3 %
Platelets: 282 10*3/uL (ref 140–400)
RBC: 5.18 10*6/uL — ABNORMAL HIGH (ref 3.80–5.10)
RDW: 12.4 % (ref 11.0–15.0)
Total Lymphocyte: 36.9 %
WBC: 6.3 10*3/uL (ref 3.8–10.8)

## 2019-05-12 LAB — COMPLETE METABOLIC PANEL WITH GFR
AG Ratio: 1.7 (calc) (ref 1.0–2.5)
ALT: 26 U/L (ref 6–29)
AST: 19 U/L (ref 10–35)
Albumin: 4.4 g/dL (ref 3.6–5.1)
Alkaline phosphatase (APISO): 92 U/L (ref 37–153)
BUN: 12 mg/dL (ref 7–25)
CO2: 27 mmol/L (ref 20–32)
Calcium: 9.6 mg/dL (ref 8.6–10.4)
Chloride: 101 mmol/L (ref 98–110)
Creat: 0.66 mg/dL (ref 0.50–1.05)
GFR, Est African American: 114 mL/min/{1.73_m2} (ref >=60)
GFR, Est Non African American: 99 mL/min/{1.73_m2} (ref >=60)
Globulin: 2.6 g/dL (calc) (ref 1.9–3.7)
Glucose, Bld: 199 mg/dL — ABNORMAL HIGH (ref 65–99)
Potassium: 4.3 mmol/L (ref 3.5–5.3)
Sodium: 139 mmol/L (ref 135–146)
Total Bilirubin: 0.3 mg/dL (ref 0.2–1.2)
Total Protein: 7 g/dL (ref 6.1–8.1)

## 2019-05-12 LAB — HEMOGLOBIN A1C
Hgb A1c MFr Bld: 7.8 % of total Hgb — ABNORMAL HIGH (ref <5.7)
Mean Plasma Glucose: 177 (calc)
eAG (mmol/L): 9.8 (calc)

## 2019-05-12 LAB — MAGNESIUM: Magnesium: 1.9 mg/dL (ref 1.5–2.5)

## 2019-05-12 LAB — VITAMIN D 25 HYDROXY (VIT D DEFICIENCY, FRACTURES): Vit D, 25-Hydroxy: 47 ng/mL (ref 30–100)

## 2019-05-12 LAB — TSH: TSH: 2.32 mIU/L (ref 0.40–4.50)

## 2019-05-29 ENCOUNTER — Other Ambulatory Visit: Payer: Self-pay | Admitting: Physician Assistant

## 2019-05-29 DIAGNOSIS — F419 Anxiety disorder, unspecified: Secondary | ICD-10-CM

## 2019-08-12 NOTE — Progress Notes (Signed)
FOLLOW UP  Assessment and Plan:   Hypertension -patient declines medication, discussed if any changes in kidney function or protein will start on ARB, otherwise will do DASH diet/exercise and monitor blood pressure at home.  Reminder to go to the ER if any CP, SOB, nausea, dizziness, severe HA, changes vision/speech, left arm numbness and tingling and jaw pain.  Cholesterol -Continue diet and exercise. Check cholesterol. - refuses statins, understands risk of MI/stroke. Could not tolerate zetia/vasepa.  Will try nexlitrol however patient is very motivated with weight loss/healthy diet Read fiber fuel   Diabetes with hyperlipidemia -Continue diet and exercise. Check A1C -will work on weight loss, can try rybelsus but prefer more holistic approach at this time  Vitamin D Def - check level and continue medications.   Obesity - follow up 3 months for progress monitoring - increase veggies, decrease carbs - long discussion about weight loss, diet, and exercise  Continue diet and meds as discussed. Further disposition pending results of labs. Discussed med's effects and SE's.   Over 30 minutes of exam, counseling, chart review, and critical decision making was performed Future Appointments  Date Time Provider Department Center  02/08/2020  9:00 AM Quentin Mulling, PA-C [id number] None     HPI 59 y.o. female  presents for 3 month follow up on hypertension, cholesterol, diabetes and vitamin D deficiency.   Started new job at [employer].   Her blood pressure has been controlled at home, today their BP is BP: 126/88   BMI is Body mass index is 42.74 kg/m., she is working on diet and exercise. She was unable to tolerate phentermine due to inability to sleep.  Wt Readings from Last 3 Encounters:  08/13/19 249 lb (112.9 kg)  05/11/19 251 lb 12.8 oz (114.2 kg)  03/11/19 252 lb (114.3 kg)    She does workout, she is doing yoga and walking a few days a week.  She denies chest pain,  shortness of breath, dizziness.   She is not on cholesterol medication and denies myalgias She declines statin use, started on zetia last visit, no issues, could not tolerate vasepa due to muscle aches.   Her cholesterol is not at goal. The cholesterol last visit was:   Lab Results  Component Value Date   CHOL 192 05/11/2019   HDL 52 05/11/2019   LDLCALC 110 (H) 05/11/2019   TRIG 180 (H) 05/11/2019   CHOLHDL 3.7 05/11/2019   She has been working on diet and exercise for  Diabetes with hyperlipidemia NOT at goal, not on statin, stopped zetia due to joint pain, vascepa could not tolerate. Started on "natural"  she is on bASA  she is not on ACE/ARB she was started on jardiance but she stopped in June due to the heat and increasing yeast infections She is doing yoga/walking more. Is growing her own lettuce.  Given Rybelsus last visit but she did not start, has 7 mg, did not want to do injectable denies paresthesia of the feet, polydipsia, polyuria and visual disturbances.  Last A1C was:  Lab Results  Component Value Date   HGBA1C 7.8 (H) 05/11/2019     Lab Results  Component Value Date   GFRNONAA 99 05/11/2019   Patient is on Vitamin D supplement.   Lab Results  Component Value Date   VD25OH 47 05/11/2019     Current Medications:  Current Outpatient Medications on File Prior to Visit  Medication Sig  . aspirin (ASPIRIN EC) 81 MG EC tablet Take  81 mg by mouth daily. Swallow whole.  . cetirizine (ZYRTEC) 10 MG tablet Take 10 mg by mouth daily.  . Cholecalciferol (VITAMIN D) 125 MCG (5000 UT) CAPS Take by mouth daily.  . Cyanocobalamin (VITAMIN B12 PO) Take by mouth.  . escitalopram (LEXAPRO) 20 MG tablet Take 1 tablet Daily for Mood  . FIBER PO Take by mouth.   No current facility-administered medications on file prior to visit.    Medical History:  Past Medical History:  Diagnosis Date  . Allergy   . Anxiety   . History of nephrolithiasis   . Hyperlipidemia   .  Hypertension   . Prediabetes    Allergies:  Allergies  Allergen Reactions  . Codeine Nausea Only  . Penicillins      Review of Systems:  Review of Systems  Constitutional: Negative.   HENT: Negative.   Eyes: Negative.   Respiratory: Negative.   Cardiovascular: Negative.   Gastrointestinal: Negative.   Genitourinary: Negative.   Musculoskeletal: Negative.   Skin: Negative.     Family history- Review and unchanged Social history- Review and unchanged Physical Exam: BP 126/88   Pulse 68   Temp (!) 97.5 F (36.4 C)   Wt 249 lb (112.9 kg)   SpO2 97%   BMI 42.74 kg/m  Wt Readings from Last 3 Encounters:  08/13/19 249 lb (112.9 kg)  05/11/19 251 lb 12.8 oz (114.2 kg)  03/11/19 252 lb (114.3 kg)   General Appearance: Well nourished, in no apparent distress. Eyes: PERRLA, EOMs, conjunctiva no swelling or erythema Sinuses: No Frontal/maxillary tenderness ENT/Mouth: Ext aud canals clear, TMs without erythema, bulging. No erythema, swelling, or exudate on post pharynx.  Tonsils not swollen or erythematous. Hearing normal.  Neck: Supple, thyroid normal.  Respiratory: Respiratory effort normal, BS equal bilaterally without rales, rhonchi, wheezing or stridor.  Cardio: RRR with systolic murmur. Brisk peripheral pulses without edema.  Abdomen: Soft, + BS.  Non tender, no guarding, rebound, hernias, masses. Lymphatics: Non tender without lymphadenopathy.  Musculoskeletal: Full ROM, 5/5 strength, Normal gait Skin: Warm, dry without rashes, lesions, ecchymosis.  Neuro: Cranial nerves intact. No cerebellar symptoms.  Psych: Awake and oriented X 3, normal affect, Insight and Judgment appropriate.    Vicie Mutters, PA-C 8:55 AM [facility]

## 2019-08-13 ENCOUNTER — Ambulatory Visit: Payer: 59 | Admitting: Physician Assistant

## 2019-08-13 ENCOUNTER — Encounter: Payer: Self-pay | Admitting: Physician Assistant

## 2019-08-13 ENCOUNTER — Other Ambulatory Visit: Payer: Self-pay

## 2019-08-13 VITALS — BP 126/88 | HR 68 | Temp 97.5°F | Wt 249.0 lb

## 2019-08-13 DIAGNOSIS — Z79899 Other long term (current) drug therapy: Secondary | ICD-10-CM

## 2019-08-13 DIAGNOSIS — E1169 Type 2 diabetes mellitus with other specified complication: Secondary | ICD-10-CM

## 2019-08-13 DIAGNOSIS — I10 Essential (primary) hypertension: Secondary | ICD-10-CM

## 2019-08-13 DIAGNOSIS — E559 Vitamin D deficiency, unspecified: Secondary | ICD-10-CM

## 2019-08-13 DIAGNOSIS — F419 Anxiety disorder, unspecified: Secondary | ICD-10-CM

## 2019-08-13 DIAGNOSIS — E785 Hyperlipidemia, unspecified: Secondary | ICD-10-CM

## 2019-08-13 NOTE — Patient Instructions (Signed)
Your LDL is not in range or at goal, goal is less than 70.  Your LDL is the bad cholesterol that can lead to heart attack and stroke. To lower your number you can decrease your fatty foods, red meat, cheese, milk and increase fiber like whole grains and veggies. You can also add a fiber supplement like Citracel or Benefiber, these do not cause gas and bloating and are safe to use. Since you have risk factors that make [place] want your number below 70, we need to adjust or add medications to get your number below goal.   Can check out the book Fiber Fuel  RANGE OF A1C   Your A1C is a measure of your sugar over the past 3 months and is not affected by what you have eaten over the past few days. Diabetes increases your chances of stroke and heart attack over 300 % and is the leading cause of blindness and kidney failure in the [place]. Please make sure you decrease bad carbs like white bread, white rice, potatoes, corn, soft drinks, pasta, cereals, refined sugars, sweet tea, dried fruits, and fruit juice. Good carbs are okay to eat in moderation like sweet potatoes, brown rice, whole grain pasta/bread, most fruit (except dried fruit) and you can eat as many veggies as you want.   Greater than 6.5 is considered diabetic. Between 6.4 and 5.7 is prediabetic If your A1C is less than 5.7 you are NOT diabetic.  Targets for Glucose Readings: Time of Check Target for patients WITHOUT Diabetes Target for DIABETICS  Before Meals Less than 100  less than 150  Two hours after meals Less than 200  Less than 250   General eating tips  What to Avoid . Avoid added sugars o Often added sugar can be found in processed foods such as many condiments, dry cereals, cakes, cookies, chips, crisps, crackers, candies, sweetened drinks, etc.  o Read labels and AVOID/DECREASE use of foods with the following in their ingredient list: Sugar, fructose, high fructose corn syrup, sucrose, glucose, maltose, dextrose, molasses,  cane sugar, brown sugar, any type of syrup, agave nectar, etc.   . Avoid snacking in between meals- drink water or if you feel you need a snack, pick a high water content snack such as cucumbers, watermelon, or any veggie.  Marland Kitchen Avoid foods made with flour o If you are going to eat food made with flour, choose those made with whole-grains; and, minimize your consumption as much as is tolerable . Avoid processed foods o These foods are generally stocked in the middle of the grocery store.  o Focus on shopping on the perimeter of the grocery.  What to Include . Vegetables o GREEN LEAFY VEGETABLES: Kale, spinach, mustard greens, collard greens, cabbage, broccoli, etc. o OTHER: Asparagus, cauliflower, eggplant, carrots, peas, Brussel sprouts, tomatoes, bell peppers, zucchini, beets, cucumbers, etc. . Grains, seeds, and legumes o Beans: kidney beans, black eyed peas, garbanzo beans, black beans, pinto beans, etc. o Whole, unrefined grains: brown rice, barley, bulgur, oatmeal, etc. . Healthy fats  o Avoid highly processed fats such as vegetable oil o Examples of healthy fats: avocado, olives, virgin olive oil, dark chocolate (?72% Cocoa), nuts (peanuts, almonds, walnuts, cashews, pecans, etc.) o Please still do small amount of these healthy fats, they are dense in calories.  . Low - Moderate Intake of Animal Sources of Protein o Meat sources: chicken, [place], salmon, tuna. Limit to 4 ounces of meat at one time or the size  of your palm. o Consider limiting dairy sources, but when choosing dairy focus on: PLAIN [place] yogurt, cottage cheese, high-protein milk . Fruit o Choose berries

## 2019-08-14 LAB — MAGNESIUM: Magnesium: 2 mg/dL (ref 1.5–2.5)

## 2019-08-14 LAB — COMPLETE METABOLIC PANEL WITH GFR
AG Ratio: 1.9 (calc) (ref 1.0–2.5)
ALT: 25 U/L (ref 6–29)
AST: 17 U/L (ref 10–35)
Albumin: 4.5 g/dL (ref 3.6–5.1)
Alkaline phosphatase (APISO): 88 U/L (ref 37–153)
BUN: 11 mg/dL (ref 7–25)
CO2: 26 mmol/L (ref 20–32)
Calcium: 9.2 mg/dL (ref 8.6–10.4)
Chloride: 103 mmol/L (ref 98–110)
Creat: 0.58 mg/dL (ref 0.50–1.05)
GFR, Est African American: 119 mL/min/{1.73_m2} (ref >=60)
GFR, Est Non African American: 102 mL/min/{1.73_m2} (ref >=60)
Globulin: 2.4 g/dL (calc) (ref 1.9–3.7)
Glucose, Bld: 162 mg/dL — ABNORMAL HIGH (ref 65–99)
Potassium: 4.3 mmol/L (ref 3.5–5.3)
Sodium: 139 mmol/L (ref 135–146)
Total Bilirubin: 0.4 mg/dL (ref 0.2–1.2)
Total Protein: 6.9 g/dL (ref 6.1–8.1)

## 2019-08-14 LAB — CBC WITH DIFFERENTIAL/PLATELET
Absolute Monocytes: 482 cells/uL (ref 200–950)
Basophils Absolute: 34 cells/uL (ref 0–200)
Basophils Relative: 0.5 %
Eosinophils Absolute: 121 cells/uL (ref 15–500)
Eosinophils Relative: 1.8 %
HCT: 47.1 % — ABNORMAL HIGH (ref 35.0–45.0)
Hemoglobin: 15.4 g/dL (ref 11.7–15.5)
Lymphs Abs: 2372 cells/uL (ref 850–3900)
MCH: 29.6 pg (ref 27.0–33.0)
MCHC: 32.7 g/dL (ref 32.0–36.0)
MCV: 90.4 fL (ref 80.0–100.0)
MPV: 10 fL (ref 7.5–12.5)
Monocytes Relative: 7.2 %
Neutro Abs: 3692 cells/uL (ref 1500–7800)
Neutrophils Relative %: 55.1 %
Platelets: 284 10*3/uL (ref 140–400)
RBC: 5.21 10*6/uL — ABNORMAL HIGH (ref 3.80–5.10)
RDW: 12.2 % (ref 11.0–15.0)
Total Lymphocyte: 35.4 %
WBC: 6.7 10*3/uL (ref 3.8–10.8)

## 2019-08-14 LAB — VITAMIN D 25 HYDROXY (VIT D DEFICIENCY, FRACTURES): Vit D, 25-Hydroxy: 51 ng/mL (ref 30–100)

## 2019-08-14 LAB — LIPID PANEL
Cholesterol: 239 mg/dL — ABNORMAL HIGH (ref <200)
HDL: 44 mg/dL — ABNORMAL LOW (ref >=50)
LDL Cholesterol (Calc): 149 mg/dL (calc) — ABNORMAL HIGH
Non-HDL Cholesterol (Calc): 195 mg/dL (calc) — ABNORMAL HIGH (ref <130)
Total CHOL/HDL Ratio: 5.4 (calc) — ABNORMAL HIGH (ref <5.0)
Triglycerides: 296 mg/dL — ABNORMAL HIGH (ref <150)

## 2019-08-14 LAB — TSH: TSH: 2.58 mIU/L (ref 0.40–4.50)

## 2019-08-14 LAB — HEMOGLOBIN A1C
Hgb A1c MFr Bld: 6.8 % of total Hgb — ABNORMAL HIGH (ref <5.7)
Mean Plasma Glucose: 148 (calc)
eAG (mmol/L): 8.2 (calc)

## 2019-08-14 MED ORDER — ESCITALOPRAM OXALATE 20 MG PO TABS: ORAL_TABLET | ORAL | 1 refills | Status: DC

## 2019-11-13 ENCOUNTER — Encounter: Payer: Self-pay | Admitting: Physician Assistant

## 2019-11-13 ENCOUNTER — Other Ambulatory Visit: Payer: Self-pay

## 2019-11-13 ENCOUNTER — Ambulatory Visit: Payer: 59 | Admitting: Physician Assistant

## 2019-11-13 VITALS — BP 128/88 | Temp 97.5°F | Wt 241.0 lb

## 2019-11-13 DIAGNOSIS — E1169 Type 2 diabetes mellitus with other specified complication: Secondary | ICD-10-CM

## 2019-11-13 DIAGNOSIS — Z79899 Other long term (current) drug therapy: Secondary | ICD-10-CM

## 2019-11-13 DIAGNOSIS — I10 Essential (primary) hypertension: Secondary | ICD-10-CM | POA: Diagnosis not present

## 2019-11-13 DIAGNOSIS — E785 Hyperlipidemia, unspecified: Secondary | ICD-10-CM

## 2019-11-13 NOTE — Progress Notes (Signed)
FOLLOW UP  Assessment and Plan:   Hypertension -patient declines medication, discussed if any changes in kidney function or protein will start on ARB, otherwise will do DASH diet/exercise and monitor blood pressure at home.  Reminder to go to the ER if any CP, SOB, nausea, dizziness, severe HA, changes vision/speech, left arm numbness and tingling and jaw pain.  Cholesterol -Continue diet and exercise. Check cholesterol. - refuses statins, understands risk of MI/stroke. Could not tolerate zetia/vasepa.  Will try nexlitrol - samples given Read fiber fuel   Diabetes with hyperlipidemia -Continue diet and exercise. Check A1C -will work on weight loss  Vitamin D Def - check level and continue medications.   Obesity - follow up 3 months for progress monitoring - increase veggies, decrease carbs - long discussion about weight loss, diet, and exercise  Continue diet and meds as discussed. Further disposition pending results of labs. Discussed med's effects and SE's.   Over 30 minutes of exam, counseling, chart review, and critical decision making was performed Future Appointments  Date Time Provider Department Center  02/08/2020  9:00 AM Quentin Mulling, PA-C [facility] None     HPI 59 y.o. female  presents for 3 month follow up on hypertension, cholesterol, diabetes and vitamin D deficiency.   Started new job at [employer].   Her blood pressure has been controlled at home, today their BP is BP: 128/88   BMI is Body mass index is 41.37 kg/m., she is working on diet and exercise. She was unable to tolerate phentermine due to inability to sleep.  Wt Readings from Last 3 Encounters:  11/13/19 241 lb (109.3 kg)  08/13/19 249 lb (112.9 kg)  05/11/19 251 lb 12.8 oz (114.2 kg)    She does workout, she is doing yoga and walking a few days a week.  She denies chest pain, shortness of breath, dizziness.   She is not on cholesterol medication. Willing to try nexletrol. She declines  statin use, could not tolerate vasepa OR zetia due to muscle aches.    Her cholesterol is not at goal. The cholesterol last visit was:   Lab Results  Component Value Date   CHOL 239 (H) 08/13/2019   HDL 44 (L) 08/13/2019   LDLCALC 149 (H) 08/13/2019   TRIG 296 (H) 08/13/2019   CHOLHDL 5.4 (H) 08/13/2019   She has been working on diet and exercise for  Diabetes with hyperlipidemia NOT at goal, not on statin, stopped zetia due to joint pain, vascepa could not tolerate.   she is on bASA  she is not on ACE/ARB she was started on jardiance but she stopped in June due to the heat and increasing yeast infections She is doing yoga/walking more. Is growing her own lettuce.  denies paresthesia of the feet, polydipsia, polyuria and visual disturbances.  Last A1C was:  Lab Results  Component Value Date   HGBA1C 6.8 (H) 08/13/2019     Lab Results  Component Value Date   GFRNONAA 102 08/13/2019   Patient is on Vitamin D supplement.   Lab Results  Component Value Date   VD25OH 51 08/13/2019     Current Medications:  Current Outpatient Medications on File Prior to Visit  Medication Sig   aspirin (ASPIRIN EC) 81 MG EC tablet Take 81 mg by mouth daily. Swallow whole.   cetirizine (ZYRTEC) 10 MG tablet Take 10 mg by mouth daily.   Cholecalciferol (VITAMIN D) 125 MCG (5000 UT) CAPS Take by mouth daily.   Cyanocobalamin (  VITAMIN B12 PO) Take by mouth.   escitalopram (LEXAPRO) 20 MG tablet Take 1 tablet Daily for Mood   FIBER PO Take by mouth.   No current facility-administered medications on file prior to visit.    Medical History:  Past Medical History:  Diagnosis Date   Allergy    Anxiety    History of nephrolithiasis    Hyperlipidemia    Hypertension    Prediabetes    Allergies:  Allergies  Allergen Reactions   Codeine Nausea Only   Penicillins      Review of Systems:  Review of Systems  Constitutional: Negative.   HENT: Negative.   Eyes: Negative.    Respiratory: Negative.   Cardiovascular: Negative.   Gastrointestinal: Negative.   Genitourinary: Negative.   Musculoskeletal: Negative.   Skin: Negative.     Family history- Review and unchanged Social history- Review and unchanged Physical Exam: BP 128/88    Temp (!) 97.5 F (36.4 C)    Wt 241 lb (109.3 kg)    SpO2 95%    BMI 41.37 kg/m  Wt Readings from Last 3 Encounters:  11/13/19 241 lb (109.3 kg)  08/13/19 249 lb (112.9 kg)  05/11/19 251 lb 12.8 oz (114.2 kg)   General Appearance: Well nourished, in no apparent distress. Eyes: PERRLA, EOMs, conjunctiva no swelling or erythema Sinuses: No Frontal/maxillary tenderness ENT/Mouth: Ext aud canals clear, TMs without erythema, bulging. No erythema, swelling, or exudate on post pharynx.  Tonsils not swollen or erythematous. Hearing normal.  Neck: Supple, thyroid normal.  Respiratory: Respiratory effort normal, BS equal bilaterally without rales, rhonchi, wheezing or stridor.  Cardio: RRR with systolic murmur. Brisk peripheral pulses without edema.  Abdomen: Soft, + BS.  Non tender, no guarding, rebound, hernias, masses. Lymphatics: Non tender without lymphadenopathy.  Musculoskeletal: Full ROM, 5/5 strength, Normal gait Skin: Warm, dry without rashes, lesions, ecchymosis.  Neuro: Cranial nerves intact. No cerebellar symptoms.  Psych: Awake and oriented X 3, normal affect, Insight and Judgment appropriate.    Quentin Mulling, PA-C 9:16 AM [facility]

## 2019-11-13 NOTE — Patient Instructions (Signed)
Your LDL is not in range or at goal, goal is less than 70.  Your LDL is the bad cholesterol that can lead to heart attack and stroke. To lower your number you can decrease your fatty foods, red meat, cheese, milk and increase fiber like whole grains and veggies. You can also add a fiber supplement like Citracel or Benefiber, these do not cause gas and bloating and are safe to use. Since you have risk factors that make [place] want your number below 70, we need to adjust or add medications to get your number below goal.   General eating tips  What to Avoid . Avoid added sugars o Often added sugar can be found in processed foods such as many condiments, dry cereals, cakes, cookies, chips, crisps, crackers, candies, sweetened drinks, etc.  o Read labels and AVOID/DECREASE use of foods with the following in their ingredient list: Sugar, fructose, high fructose corn syrup, sucrose, glucose, maltose, dextrose, molasses, cane sugar, brown sugar, any type of syrup, agave nectar, etc.   . Avoid snacking in between meals- drink water or if you feel you need a snack, pick a high water content snack such as cucumbers, watermelon, or any veggie.  Marland Kitchen Avoid foods made with flour o If you are going to eat food made with flour, choose those made with whole-grains; and, minimize your consumption as much as is tolerable . Avoid processed foods o These foods are generally stocked in the middle of the grocery store.  o Focus on shopping on the perimeter of the grocery.  What to Include . Vegetables o GREEN LEAFY VEGETABLES: Kale, spinach, mustard greens, collard greens, cabbage, broccoli, etc. o OTHER: Asparagus, cauliflower, eggplant, carrots, peas, Brussel sprouts, tomatoes, bell peppers, zucchini, beets, cucumbers, etc. . Grains, seeds, and legumes o Beans: kidney beans, black eyed peas, garbanzo beans, black beans, pinto beans, etc. o Whole, unrefined grains: brown rice, barley, bulgur, oatmeal, etc. . Healthy fats   o Avoid highly processed fats such as vegetable oil o Examples of healthy fats: avocado, olives, virgin olive oil, dark chocolate (?72% Cocoa), nuts (peanuts, almonds, walnuts, cashews, pecans, etc.) o Please still do small amount of these healthy fats, they are dense in calories.  . Low - Moderate Intake of Animal Sources of Protein o Meat sources: chicken, [place], salmon, tuna. Limit to 4 ounces of meat at one time or the size of your palm. o Consider limiting dairy sources, but when choosing dairy focus on: PLAIN [place] yogurt, cottage cheese, high-protein milk . Fruit o Choose berries

## 2019-11-14 LAB — CBC WITH DIFFERENTIAL/PLATELET
Absolute Monocytes: 470 cells/uL (ref 200–950)
Basophils Absolute: 41 cells/uL (ref 0–200)
Basophils Relative: 0.7 %
Eosinophils Absolute: 139 cells/uL (ref 15–500)
Eosinophils Relative: 2.4 %
HCT: 44.8 % (ref 35.0–45.0)
Hemoglobin: 14.9 g/dL (ref 11.7–15.5)
Lymphs Abs: 2413 cells/uL (ref 850–3900)
MCH: 29.8 pg (ref 27.0–33.0)
MCHC: 33.3 g/dL (ref 32.0–36.0)
MCV: 89.6 fL (ref 80.0–100.0)
MPV: 9.8 fL (ref 7.5–12.5)
Monocytes Relative: 8.1 %
Neutro Abs: 2738 cells/uL (ref 1500–7800)
Neutrophils Relative %: 47.2 %
Platelets: 282 10*3/uL (ref 140–400)
RBC: 5 10*6/uL (ref 3.80–5.10)
RDW: 12.3 % (ref 11.0–15.0)
Total Lymphocyte: 41.6 %
WBC: 5.8 10*3/uL (ref 3.8–10.8)

## 2019-11-14 LAB — LIPID PANEL
Cholesterol: 234 mg/dL — ABNORMAL HIGH (ref <200)
HDL: 44 mg/dL — ABNORMAL LOW (ref >=50)
LDL Cholesterol (Calc): 151 mg/dL (calc) — ABNORMAL HIGH
Non-HDL Cholesterol (Calc): 190 mg/dL (calc) — ABNORMAL HIGH (ref <130)
Total CHOL/HDL Ratio: 5.3 (calc) — ABNORMAL HIGH (ref <5.0)
Triglycerides: 229 mg/dL — ABNORMAL HIGH (ref <150)

## 2019-11-14 LAB — COMPLETE METABOLIC PANEL WITH GFR
AG Ratio: 1.7 (calc) (ref 1.0–2.5)
ALT: 26 U/L (ref 6–29)
AST: 17 U/L (ref 10–35)
Albumin: 4.3 g/dL (ref 3.6–5.1)
Alkaline phosphatase (APISO): 74 U/L (ref 37–153)
BUN: 13 mg/dL (ref 7–25)
CO2: 27 mmol/L (ref 20–32)
Calcium: 9.5 mg/dL (ref 8.6–10.4)
Chloride: 103 mmol/L (ref 98–110)
Creat: 0.7 mg/dL (ref 0.50–1.05)
GFR, Est African American: 111 mL/min/{1.73_m2} (ref >=60)
GFR, Est Non African American: 96 mL/min/{1.73_m2} (ref >=60)
Globulin: 2.5 g/dL (calc) (ref 1.9–3.7)
Glucose, Bld: 142 mg/dL — ABNORMAL HIGH (ref 65–99)
Potassium: 4.3 mmol/L (ref 3.5–5.3)
Sodium: 139 mmol/L (ref 135–146)
Total Bilirubin: 0.4 mg/dL (ref 0.2–1.2)
Total Protein: 6.8 g/dL (ref 6.1–8.1)

## 2019-11-14 LAB — HEMOGLOBIN A1C
Hgb A1c MFr Bld: 6.9 % of total Hgb — ABNORMAL HIGH (ref <5.7)
Mean Plasma Glucose: 151 (calc)
eAG (mmol/L): 8.4 (calc)

## 2019-11-14 LAB — TSH: TSH: 2.27 mIU/L (ref 0.40–4.50)

## 2020-01-14 ENCOUNTER — Encounter: Payer: 59 | Admitting: Physician Assistant

## 2020-02-01 ENCOUNTER — Encounter: Payer: 59 | Admitting: Physician Assistant

## 2020-02-08 ENCOUNTER — Encounter: Payer: 59 | Admitting: Adult Health

## 2020-03-05 LAB — HM MAMMOGRAPHY

## 2020-03-10 ENCOUNTER — Encounter: Payer: Self-pay | Admitting: Internal Medicine

## 2020-03-13 ENCOUNTER — Other Ambulatory Visit: Payer: Self-pay | Admitting: Internal Medicine

## 2020-03-13 DIAGNOSIS — F419 Anxiety disorder, unspecified: Secondary | ICD-10-CM

## 2020-03-13 MED ORDER — ESCITALOPRAM OXALATE 20 MG PO TABS: ORAL_TABLET | ORAL | 0 refills | Status: DC

## 2020-03-27 NOTE — Progress Notes (Addendum)
[facility]  Assessment and Plan:  Kaitlin Garcia was seen today for annual exam.  Diagnoses and all orders for this visit:  Encounter for general adult medical examination with abnormal findings Yearly  Essential hypertension No medications, elevated today Check blood pressure at home BID, log provided for patient Monitor blood pressure at home; call if consistently over 150/90 Continue DASH diet.   Reminder to go to the ER if any CP, SOB, nausea, dizziness, severe HA, changes vision/speech, left arm numbness and tingling and jaw pain. -     CBC with Differential/Platelet -     COMPLETE METABOLIC PANEL WITH GFR -     TSH -     Magnesium  Hyperlipidemia associated with type 2 diabetes mellitus (HCC) No medications Discussed dietary and exercise modifications Low fat diet -     Lipid panel  Type 2 diabetes mellitus with hyperlipidemia (HCC) Diet and lifestyle controlled -     Hemoglobin A1c  Vitamin D deficiency Continue supplementation to maintain goal of 70-100 Taking Vitamin D 5,000 IU daily -     VITAMIN D 25 Hydroxy (Vit-D Deficiency, Fractures)  Anxiety Doing well on current regiment Continue  Discussed stress management techniques   Discussed good sleep hygiene Discussed increasing physical activity and exercise Increase water intake  Environmental allergies Using zyrtec PRN Discussed changing to xyzal PRN  Morbid obesity (HCC) Discussed dietary and exercise modifications  History of nephrolithiasis Monitor Asymptomatic today  Medication management Continued  Screening for hematuria or proteinuria -     Urinalysis w microscopic + reflex cultur -     Microalbumin / creatinine urine ratio  Screening, ischemic heart disease -     EKG 12-Lead  Encounter for vitamin deficiency screening -     Vitamin B12  Screening for thyroid disorder -TSH  Screening, iron deficiency anemia -     Iron,Total/Total Iron Binding Cap   Further disposition  pending results if labs check today. Discussed med's effects and SE's.   Over 30 minutes of face to face interview, exam, counseling, chart review, and critical decision making was performed.    Future Appointments  Date Time Provider Department Center  03/28/2021 10:00 AM Elder Negus, NP [facility] None     HPI 59 y.o. female  presents for CPE and follow up with hypertension, hyperlipidemia, diabetes and vitamin D. Her blood pressure has been controlled at home, today their BP is BP: (!) 160/100, rechecked and decreased 140/87.  Discussed monitoring at home.  She does workout. She denies chest pain, shortness of breath, dizziness.   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    BMI is Body mass index is 42.23 kg/m., she is working on diet and exercise. Wt Readings from Last 3 Encounters:  03/28/20 246 lb (111.6 kg)  11/13/19 241 lb (109.3 kg)  08/13/19 249 lb (112.9 kg)   She is on cholesterol medication and denies myalgias. Her cholesterol is at goal. Lab Results  Component Value Date   CHOL 234 (H) 11/13/2019   HDL 44 (L) 11/13/2019   LDLCALC 151 (H) 11/13/2019   TRIG 229 (H) 11/13/2019   CHOLHDL 5.3 (H) 11/13/2019   She has been working on diet and exercise for Diabetes  With hyperlipidemia- refuses statins- never started zetia Could not tolerate ozempic, states it gave her left flank pain- has history of kidney stones s/p lithotripsy- has seen Dr. Isabel Caprice in the past.  Could not tolerate jardiance due to yeast she  is on bASA does not check sugars at Home denies paresthesia of the feet, polydipsia, polyuria and visual disturbances  Lab Results  Component Value Date   HGBA1C 6.9 (H) 11/13/2019   Lab Results  Component Value Date   GFRNONAA 96 11/13/2019  Patient is on Vitamin D supplement. She is doing well on the lexapro.    Current Medications:     Current Outpatient Medications (Respiratory):  .  cetirizine  (ZYRTEC) 10 MG tablet, Take 10 mg by mouth daily.  Current Outpatient Medications (Analgesics):  .  aspirin (ASPIRIN EC) 81 MG EC tablet, Take 81 mg by mouth daily. Swallow whole.  Current Outpatient Medications (Hematological):  Marland Kitchen  Cyanocobalamin (VITAMIN B12 PO), Take by mouth.  Current Outpatient Medications (Other):  Marland Kitchen  Cholecalciferol (VITAMIN D) 125 MCG (5000 UT) CAPS, Take by mouth daily. Marland Kitchen  escitalopram (LEXAPRO) 20 MG tablet, Take      1 tablet      Daily        for Mood ( must have office visit before next refill ) .  FIBER PO, Take by mouth.  Medical History:  Past Medical History:  Diagnosis Date  . Allergy   . Anxiety   . History of nephrolithiasis   . Hyperlipidemia   . Hypertension   . Prediabetes    Preventative Medicine Immunization History  Administered Date(s) Administered  . Influenza Inj Mdck Quad With Preservative 01/02/2018, 01/30/2019  . Influenza Split 12/01/2013  . Influenza, Seasonal, Injecte, Preservative Fre 03/02/2015  . Janssen (J&J) SARS-COV-2 Vaccination 08/21/2019  . Pneumococcal Polysaccharide-23 06/09/2013  . Tdap 08/11/2007, 01/02/2018   Tetanus: 2019 year Pneumovax: 2015 Prevnar 13: due at age 46 Flu vaccine:DUE Zostavax: N/A  Pap: 2016, Due 2022 3DMGM: 03/2020 DEXA: N/A Colonoscopy: will schedule EGD: N/A  Allergies Allergies  Allergen Reactions  . Codeine Nausea Only  . Penicillins     SURGICAL HISTORY She  has a past surgical history that includes Tonsillectomy and adenoidectomy and Cesarean section (1995). FAMILY HISTORY Her family history includes Cancer in her father; Hypertension in her father and mother. SOCIAL HISTORY She  reports that she has quit smoking. She has never used smokeless tobacco. She reports that she does not drink alcohol and does not use drugs.  Review of Systems  Constitutional: Negative.   HENT: Negative.   Eyes: Negative.   Respiratory: Negative.   Cardiovascular: Negative.    Gastrointestinal: Negative.   Genitourinary: Negative.   Musculoskeletal: Negative.   Skin: Negative.   Neurological: Negative.   Endo/Heme/Allergies: Negative.   Psychiatric/Behavioral: Negative.     Physical Exam: BP (!) 160/100   Pulse 63   Temp (!) 97.3 F (36.3 C)   Wt 246 lb (111.6 kg)   SpO2 98%   BMI 42.23 kg/m  Wt Readings from Last 3 Encounters:  03/28/20 246 lb (111.6 kg)  11/13/19 241 lb (109.3 kg)  08/13/19 249 lb (112.9 kg)   General Appearance: Well nourished, in no apparent distress. Eyes: PERRLA, EOMs, conjunctiva no swelling or erythema Sinuses: No Frontal/maxillary tenderness ENT/Mouth: Ext aud canals clear, TMs without erythema, bulging. No erythema, swelling, or exudate on post pharynx.  Tonsils not swollen or erythematous. Hearing normal.  Neck: Supple, thyroid normal.  Respiratory: Respiratory effort normal, BS equal bilaterally without rales, rhonchi, wheezing or stridor.  Cardio: RRR with no MRGs. Brisk peripheral pulses without edema.  Abdomen: Soft, + BS.  Non tender, no guarding, rebound, hernias, masses. Lymphatics: Non tender without lymphadenopathy.  Musculoskeletal:  Full ROM, 5/5 strength, normal gait.  Skin: Warm, dry without rashes, lesions, ecchymosis.  Neuro: Cranial nerves intact. No cerebellar symptoms. Sensation intact.  Psych: Awake and oriented X 3, normal affect, Insight and Judgment appropriate.   EKG WNL, no ST changes    Elder Negus, NP 10:30 AM [facility]

## 2020-03-28 ENCOUNTER — Encounter: Payer: Self-pay | Admitting: Adult Health Nurse Practitioner

## 2020-03-28 ENCOUNTER — Ambulatory Visit (INDEPENDENT_AMBULATORY_CARE_PROVIDER_SITE_OTHER): Payer: 59 | Admitting: Adult Health Nurse Practitioner

## 2020-03-28 ENCOUNTER — Other Ambulatory Visit: Payer: Self-pay

## 2020-03-28 VITALS — BP 160/100 | HR 63 | Temp 97.3°F | Wt 246.0 lb

## 2020-03-28 DIAGNOSIS — Z0000 Encounter for general adult medical examination without abnormal findings: Secondary | ICD-10-CM

## 2020-03-28 DIAGNOSIS — E785 Hyperlipidemia, unspecified: Secondary | ICD-10-CM

## 2020-03-28 DIAGNOSIS — Z79899 Other long term (current) drug therapy: Secondary | ICD-10-CM | POA: Diagnosis not present

## 2020-03-28 DIAGNOSIS — Z1329 Encounter for screening for other suspected endocrine disorder: Secondary | ICD-10-CM

## 2020-03-28 DIAGNOSIS — Z87442 Personal history of urinary calculi: Secondary | ICD-10-CM

## 2020-03-28 DIAGNOSIS — Z130 Encounter for screening for diseases of the blood and blood-forming organs and certain disorders involving the immune mechanism: Secondary | ICD-10-CM

## 2020-03-28 DIAGNOSIS — I10 Essential (primary) hypertension: Secondary | ICD-10-CM | POA: Diagnosis not present

## 2020-03-28 DIAGNOSIS — Z9109 Other allergy status, other than to drugs and biological substances: Secondary | ICD-10-CM

## 2020-03-28 DIAGNOSIS — F419 Anxiety disorder, unspecified: Secondary | ICD-10-CM

## 2020-03-28 DIAGNOSIS — E1169 Type 2 diabetes mellitus with other specified complication: Secondary | ICD-10-CM

## 2020-03-28 DIAGNOSIS — Z0001 Encounter for general adult medical examination with abnormal findings: Secondary | ICD-10-CM

## 2020-03-28 DIAGNOSIS — Z136 Encounter for screening for cardiovascular disorders: Secondary | ICD-10-CM

## 2020-03-28 DIAGNOSIS — Z13220 Encounter for screening for lipoid disorders: Secondary | ICD-10-CM | POA: Diagnosis not present

## 2020-03-28 DIAGNOSIS — Z131 Encounter for screening for diabetes mellitus: Secondary | ICD-10-CM

## 2020-03-28 DIAGNOSIS — Z1321 Encounter for screening for nutritional disorder: Secondary | ICD-10-CM

## 2020-03-28 DIAGNOSIS — E559 Vitamin D deficiency, unspecified: Secondary | ICD-10-CM | POA: Diagnosis not present

## 2020-03-28 DIAGNOSIS — Z1389 Encounter for screening for other disorder: Secondary | ICD-10-CM

## 2020-03-28 NOTE — Patient Instructions (Signed)
   Check your blood pressure at home twice a day and record the blood pressure and pulse over the next two weeks.  If you blood pressure is consistently 150/90 or higher we need to talk about treating with blood pressure medication.  Send me a MyChart message with some of the readings at the end of two weeks.   Increase the amount of water you drink every day from 3 bottles to four.  Work on cutting any sodas or sugary drinks.  This is an easy steep to help aid in your goals.  I like that you have plans to increase your activity and walking to a day.   GENERAL HEALTH GOALS  Know what a healthy weight is for you (roughly BMI <25) and aim to maintain this  Aim for 7+ servings of fruits and vegetables daily  70-80+ fluid ounces of water or unsweet tea for healthy kidneys  Limit to max 1 drink of alcohol per day; avoid smoking/tobacco  Limit animal fats in diet for cholesterol and heart health - choose grass fed whenever available  Avoid highly processed foods, and foods high in saturated/trans fats  Aim for low stress - take time to unwind and care for your mental health  Aim for 150 min of moderate intensity exercise weekly for heart health, and weights twice weekly for bone health  Aim for 7-9 hours of sleep daily

## 2020-03-30 LAB — URINE CULTURE
MICRO NUMBER:: 11359608
SPECIMEN QUALITY:: ADEQUATE

## 2020-03-30 LAB — CBC WITH DIFFERENTIAL/PLATELET
Absolute Monocytes: 467 cells/uL (ref 200–950)
Basophils Absolute: 49 cells/uL (ref 0–200)
Basophils Relative: 0.6 %
Eosinophils Absolute: 131 cells/uL (ref 15–500)
Eosinophils Relative: 1.6 %
HCT: 45 % (ref 35.0–45.0)
Hemoglobin: 15.1 g/dL (ref 11.7–15.5)
Lymphs Abs: 3083 cells/uL (ref 850–3900)
MCH: 29.5 pg (ref 27.0–33.0)
MCHC: 33.6 g/dL (ref 32.0–36.0)
MCV: 87.9 fL (ref 80.0–100.0)
MPV: 9.4 fL (ref 7.5–12.5)
Monocytes Relative: 5.7 %
Neutro Abs: 4469 cells/uL (ref 1500–7800)
Neutrophils Relative %: 54.5 %
Platelets: 292 10*3/uL (ref 140–400)
RBC: 5.12 10*6/uL — ABNORMAL HIGH (ref 3.80–5.10)
RDW: 12.4 % (ref 11.0–15.0)
Total Lymphocyte: 37.6 %
WBC: 8.2 10*3/uL (ref 3.8–10.8)

## 2020-03-30 LAB — COMPLETE METABOLIC PANEL WITH GFR
AG Ratio: 1.6 (calc) (ref 1.0–2.5)
ALT: 34 U/L — ABNORMAL HIGH (ref 6–29)
AST: 24 U/L (ref 10–35)
Albumin: 4.4 g/dL (ref 3.6–5.1)
Alkaline phosphatase (APISO): 81 U/L (ref 37–153)
BUN: 10 mg/dL (ref 7–25)
CO2: 27 mmol/L (ref 20–32)
Calcium: 9.5 mg/dL (ref 8.6–10.4)
Chloride: 102 mmol/L (ref 98–110)
Creat: 0.59 mg/dL (ref 0.50–1.05)
GFR, Est African American: 118 mL/min/{1.73_m2} (ref >=60)
GFR, Est Non African American: 102 mL/min/{1.73_m2} (ref >=60)
Globulin: 2.7 g/dL (calc) (ref 1.9–3.7)
Glucose, Bld: 166 mg/dL — ABNORMAL HIGH (ref 65–99)
Potassium: 4.4 mmol/L (ref 3.5–5.3)
Sodium: 138 mmol/L (ref 135–146)
Total Bilirubin: 0.4 mg/dL (ref 0.2–1.2)
Total Protein: 7.1 g/dL (ref 6.1–8.1)

## 2020-03-30 LAB — HEMOGLOBIN A1C
Hgb A1c MFr Bld: 7.1 % of total Hgb — ABNORMAL HIGH (ref <5.7)
Mean Plasma Glucose: 157 mg/dL
eAG (mmol/L): 8.7 mmol/L

## 2020-03-30 LAB — MICROALBUMIN / CREATININE URINE RATIO
Creatinine, Urine: 117 mg/dL (ref 20–275)
Microalb Creat Ratio: 15 mcg/mg creat (ref <30)
Microalb, Ur: 1.7 mg/dL

## 2020-03-30 LAB — MAGNESIUM: Magnesium: 2.1 mg/dL (ref 1.5–2.5)

## 2020-03-30 LAB — URINALYSIS W MICROSCOPIC + REFLEX CULTURE
Bacteria, UA: NONE SEEN /HPF
Bilirubin Urine: NEGATIVE
Glucose, UA: NEGATIVE
Hgb urine dipstick: NEGATIVE
Hyaline Cast: NONE SEEN /LPF
Ketones, ur: NEGATIVE
Nitrites, Initial: NEGATIVE
Protein, ur: NEGATIVE
Specific Gravity, Urine: 1.02 (ref 1.001–1.03)
Squamous Epithelial / HPF: NONE SEEN /HPF (ref <=5)
pH: 6 (ref 5.0–8.0)

## 2020-03-30 LAB — LIPID PANEL
Cholesterol: 248 mg/dL — ABNORMAL HIGH (ref <200)
HDL: 46 mg/dL — ABNORMAL LOW (ref >=50)
LDL Cholesterol (Calc): 158 mg/dL (calc) — ABNORMAL HIGH
Non-HDL Cholesterol (Calc): 202 mg/dL (calc) — ABNORMAL HIGH (ref <130)
Total CHOL/HDL Ratio: 5.4 (calc) — ABNORMAL HIGH (ref <5.0)
Triglycerides: 269 mg/dL — ABNORMAL HIGH (ref <150)

## 2020-03-30 LAB — VITAMIN B12: Vitamin B-12: >2000 pg/mL — ABNORMAL HIGH (ref 200–1100)

## 2020-03-30 LAB — IRON, TOTAL/TOTAL IRON BINDING CAP
%SAT: 30 % (calc) (ref 16–45)
Iron: 110 ug/dL (ref 45–160)
TIBC: 372 mcg/dL (calc) (ref 250–450)

## 2020-03-30 LAB — CULTURE INDICATED

## 2020-03-30 LAB — VITAMIN D 25 HYDROXY (VIT D DEFICIENCY, FRACTURES): Vit D, 25-Hydroxy: 57 ng/mL (ref 30–100)

## 2020-03-30 LAB — TSH: TSH: 2.06 mIU/L (ref 0.40–4.50)

## 2020-06-04 ENCOUNTER — Other Ambulatory Visit: Payer: Self-pay | Admitting: Internal Medicine

## 2020-06-04 DIAGNOSIS — F419 Anxiety disorder, unspecified: Secondary | ICD-10-CM

## 2020-06-28 ENCOUNTER — Ambulatory Visit: Payer: 59 | Admitting: Adult Health Nurse Practitioner

## 2020-12-06 NOTE — Progress Notes (Signed)
Assessment and Plan:  Kaitlin Garcia was seen today for medication management.  Diagnoses and all orders for this visit:  Essential hypertension -     hydrochlorothiazide (HYDRODIURIL) 25 MG tablet; Take 1 tablet (25 mg total) by mouth daily. -     CBC with Differential/Platelet - - continue medications, DASH diet, exercise and monitor at home. Call if greater than 130/80.  Follow up in 4 weeks for recheck Go to the ER if any chest pain, shortness of breath, nausea, dizziness, severe HA, changes vision/speech   Anxiety -     escitalopram (LEXAPRO) 20 MG tablet; TAKE 1 TABLET BY MOUTH DAILY FOR MOOD (MUST HAVE OFFICE VISIT BEFORE NEXT REFILL)  Continue diet and exercise  Hyperlipidemia associated with type 2 diabetes mellitus (HCC) -     COMPLETE METABOLIC PANEL WITH GFR -     Lipid panel - Continue diet and exercise  Type 2 diabetes mellitus with hyperlipidemia (HCC) -     Hemoglobin A1c -     Urinalysis, Routine w reflex microscopic -     Microalbumin / creatinine urine ratio - Continue diet and exercsie  Vitamin D deficiency -     VITAMIN D 25 Hydroxy (Vit-D Deficiency, Fractures)  Morbid obesity (HCC) -     TSH - Long discussion on diet , importance of regular meals.  Encouraged fresh fruits and vegetables, fiber and limiting red meat , processed carbs and dairy.  Medication management -     CBC with Differential/Platelet -     COMPLETE METABOLIC PANEL WITH GFR -     Lipid panel -     TSH -     Hemoglobin A1c -     VITAMIN D 25 Hydroxy (Vit-D Deficiency, Fractures) -     Magnesium -     Urinalysis, Routine w reflex microscopic -     Microalbumin / creatinine urine ratio      Further disposition pending results of labs. Discussed med's effects and SE's.   Over 30 minutes of exam, counseling, chart review, and critical decision making was performed.   Future Appointments  Date Time Provider Department Center  03/28/2021 10:00 AM Revonda Humphrey, NP [facility] None     ------------------------------------------------------------------------------------------------------------------   HPI BP (!) 170/100   Pulse 66   Temp (!) 97.5 F (36.4 C)   Wt 248 lb (112.5 kg)   SpO2 99%   BMI 42.57 kg/m  60 y.o.female presents for med evaluation.  Pt is currently on Lexapro , mood is well controlled.   Blood pressure is not controlled at home 140/90-170/100, today's BP is 170/100.  Is not currently not on blood pressure medication.   BP Readings from Last 3 Encounters:  12/07/20 (!) 170/100  03/28/20 (!) 160/100  11/13/19 128/88    Last A1C was diabetic range, pt has been exercising and trying to watch her diet.  Does work from home and sits 10-12 hours a day Lab Results  Component Value Date   HGBA1C 7.1 (H) 03/28/2020    BMI is Body mass index is 42.57 kg/m., she has been working on diet and exercise. Wt Readings from Last 3 Encounters:  12/07/20 248 lb (112.5 kg)  03/28/20 246 lb (111.6 kg)  11/13/19 241 lb (109.3 kg)    Cholesterol is not at goal, currently on no medication.  Has been trying to eat less saturated fats and more fresh fruits/vegetables/fiber Lab Results  Component Value Date   CHOL 248 (H) 03/28/2020   HDL  46 (L) 03/28/2020   LDLCALC 158 (H) 03/28/2020   TRIG 269 (H) 03/28/2020   CHOLHDL 5.4 (H) 03/28/2020    Past Medical History:  Diagnosis Date   Allergy    Anxiety    History of nephrolithiasis    Hyperlipidemia    Hypertension    Prediabetes      Allergies  Allergen Reactions   Codeine Nausea Only   Penicillins     Current Outpatient Medications on File Prior to Visit  Medication Sig   aspirin 81 MG EC tablet Take 81 mg by mouth daily. Swallow whole.   BIOTIN PO Take by mouth daily.   cetirizine (ZYRTEC) 10 MG tablet Take 10 mg by mouth daily.   Multiple Vitamin (MULTIVITAMIN ADULT PO) Take by mouth daily.   Probiotic Product (PROBIOTIC DAILY PO) Take by mouth.   No current facility-administered  medications on file prior to visit.    Review of Systems  Constitutional:  Negative for chills and fever.  HENT:  Negative for congestion, hearing loss, sore throat and tinnitus.   Eyes:  Negative for blurred vision and double vision.  Respiratory:  Negative for cough and shortness of breath.   Cardiovascular:  Negative for chest pain, palpitations and leg swelling.  Gastrointestinal:  Negative for abdominal pain, constipation, diarrhea, heartburn, nausea and vomiting.  Genitourinary:  Negative for dysuria.  Musculoskeletal:  Negative for back pain and joint pain.  Skin:  Negative for rash.  Neurological:  Negative for dizziness and headaches.  Endo/Heme/Allergies:  Does not bruise/bleed easily.  Psychiatric/Behavioral:  Negative for depression. The patient is not nervous/anxious.     Physical Exam:  BP (!) 170/100   Pulse 66   Temp (!) 97.5 F (36.4 C)   Wt 248 lb (112.5 kg)   SpO2 99%   BMI 42.57 kg/m   General Appearance: Obese female in no apparent distress. Eyes: PERRLA, EOMs, conjunctiva no swelling or erythema Sinuses: No Frontal/maxillary tenderness ENT/Mouth: Ext aud canals clear, TMs without erythema, bulging. No erythema, swelling, or exudate on post pharynx.  Tonsils not swollen or erythematous. Hearing normal.  Neck: Supple, thyroid normal.  Respiratory: Respiratory effort normal, BS equal bilaterally without rales, rhonchi, wheezing or stridor.  Cardio: RRR with no MRGs. Brisk peripheral pulses without edema.  Abdomen: Soft, + BS.  Non tender, no guarding, rebound, hernias, masses. Lymphatics: Non tender without lymphadenopathy.  Musculoskeletal: Full ROM, 5/5 strength, normal gait.  Skin: Warm, dry without rashes, lesions, ecchymosis.  Neuro: Cranial nerves intact. Normal muscle tone, no cerebellar symptoms. Sensation intact.  Psych: Awake and oriented X 3, normal affect, Insight and Judgment appropriate.     Revonda Humphrey, NP 9:37 AM [facility]

## 2020-12-07 ENCOUNTER — Other Ambulatory Visit: Payer: Self-pay

## 2020-12-07 ENCOUNTER — Ambulatory Visit (INDEPENDENT_AMBULATORY_CARE_PROVIDER_SITE_OTHER): Payer: 59 | Admitting: Nurse Practitioner

## 2020-12-07 ENCOUNTER — Encounter: Payer: Self-pay | Admitting: Nurse Practitioner

## 2020-12-07 VITALS — BP 170/100 | HR 66 | Temp 97.5°F | Wt 248.0 lb

## 2020-12-07 DIAGNOSIS — E559 Vitamin D deficiency, unspecified: Secondary | ICD-10-CM

## 2020-12-07 DIAGNOSIS — Z79899 Other long term (current) drug therapy: Secondary | ICD-10-CM

## 2020-12-07 DIAGNOSIS — E785 Hyperlipidemia, unspecified: Secondary | ICD-10-CM | POA: Diagnosis not present

## 2020-12-07 DIAGNOSIS — I10 Essential (primary) hypertension: Secondary | ICD-10-CM

## 2020-12-07 DIAGNOSIS — E1169 Type 2 diabetes mellitus with other specified complication: Secondary | ICD-10-CM

## 2020-12-07 DIAGNOSIS — F419 Anxiety disorder, unspecified: Secondary | ICD-10-CM | POA: Diagnosis not present

## 2020-12-07 MED ORDER — ESCITALOPRAM OXALATE 20 MG PO TABS: ORAL_TABLET | ORAL | 1 refills | Status: DC

## 2020-12-07 MED ORDER — HYDROCHLOROTHIAZIDE 25 MG PO TABS: 25.0000 mg | ORAL_TABLET | Freq: Every day | ORAL | 3 refills | Status: DC

## 2020-12-07 NOTE — Patient Instructions (Signed)

## 2020-12-08 LAB — CBC WITH DIFFERENTIAL/PLATELET
Absolute Monocytes: 440 cells/uL (ref 200–950)
Basophils Absolute: 37 cells/uL (ref 0–200)
Basophils Relative: 0.6 %
Eosinophils Absolute: 87 cells/uL (ref 15–500)
Eosinophils Relative: 1.4 %
HCT: 46.6 % — ABNORMAL HIGH (ref 35.0–45.0)
Hemoglobin: 15.4 g/dL (ref 11.7–15.5)
Lymphs Abs: 2257 cells/uL (ref 850–3900)
MCH: 29.7 pg (ref 27.0–33.0)
MCHC: 33 g/dL (ref 32.0–36.0)
MCV: 90 fL (ref 80.0–100.0)
MPV: 10 fL (ref 7.5–12.5)
Monocytes Relative: 7.1 %
Neutro Abs: 3379 cells/uL (ref 1500–7800)
Neutrophils Relative %: 54.5 %
Platelets: 293 10*3/uL (ref 140–400)
RBC: 5.18 10*6/uL — ABNORMAL HIGH (ref 3.80–5.10)
RDW: 12.2 % (ref 11.0–15.0)
Total Lymphocyte: 36.4 %
WBC: 6.2 10*3/uL (ref 3.8–10.8)

## 2020-12-08 LAB — MICROALBUMIN / CREATININE URINE RATIO
Creatinine, Urine: 125 mg/dL (ref 20–275)
Microalb Creat Ratio: 11 mcg/mg creat (ref <30)
Microalb, Ur: 1.4 mg/dL

## 2020-12-08 LAB — COMPLETE METABOLIC PANEL WITH GFR
AG Ratio: 1.5 (calc) (ref 1.0–2.5)
ALT: 39 U/L — ABNORMAL HIGH (ref 6–29)
AST: 25 U/L (ref 10–35)
Albumin: 4.4 g/dL (ref 3.6–5.1)
Alkaline phosphatase (APISO): 79 U/L (ref 37–153)
BUN: 13 mg/dL (ref 7–25)
CO2: 23 mmol/L (ref 20–32)
Calcium: 9.5 mg/dL (ref 8.6–10.4)
Chloride: 103 mmol/L (ref 98–110)
Creat: 0.72 mg/dL (ref 0.50–1.03)
Globulin: 2.9 g/dL (calc) (ref 1.9–3.7)
Glucose, Bld: 164 mg/dL — ABNORMAL HIGH (ref 65–99)
Potassium: 4.4 mmol/L (ref 3.5–5.3)
Sodium: 139 mmol/L (ref 135–146)
Total Bilirubin: 0.3 mg/dL (ref 0.2–1.2)
Total Protein: 7.3 g/dL (ref 6.1–8.1)
eGFR: 97 mL/min/{1.73_m2} (ref >=60)

## 2020-12-08 LAB — URINALYSIS, ROUTINE W REFLEX MICROSCOPIC
Bilirubin Urine: NEGATIVE
Glucose, UA: NEGATIVE
Hgb urine dipstick: NEGATIVE
Leukocytes,Ua: NEGATIVE
Nitrite: NEGATIVE
Protein, ur: NEGATIVE
Specific Gravity, Urine: 1.024 (ref 1.001–1.035)
pH: 5.5 (ref 5.0–8.0)

## 2020-12-08 LAB — LIPID PANEL
Cholesterol: 253 mg/dL — ABNORMAL HIGH (ref <200)
HDL: 44 mg/dL — ABNORMAL LOW (ref >=50)
LDL Cholesterol (Calc): 168 mg/dL (calc) — ABNORMAL HIGH
Non-HDL Cholesterol (Calc): 209 mg/dL (calc) — ABNORMAL HIGH (ref <130)
Total CHOL/HDL Ratio: 5.8 (calc) — ABNORMAL HIGH (ref <5.0)
Triglycerides: 241 mg/dL — ABNORMAL HIGH (ref <150)

## 2020-12-08 LAB — HEMOGLOBIN A1C
Hgb A1c MFr Bld: 6.7 % of total Hgb — ABNORMAL HIGH (ref <5.7)
Mean Plasma Glucose: 146 mg/dL
eAG (mmol/L): 8.1 mmol/L

## 2020-12-08 LAB — TSH: TSH: 2.5 mIU/L (ref 0.40–4.50)

## 2020-12-08 LAB — VITAMIN D 25 HYDROXY (VIT D DEFICIENCY, FRACTURES): Vit D, 25-Hydroxy: 50 ng/mL (ref 30–100)

## 2020-12-08 LAB — MAGNESIUM: Magnesium: 2.1 mg/dL (ref 1.5–2.5)

## 2021-01-06 NOTE — Progress Notes (Signed)
Assessment and Plan: Kaitlin Garcia was seen today for follow-up.  Diagnoses and all orders for this visit:  Essential hypertension  - continue medications, DASH diet, exercise and monitor at home. Call if greater than 130/80.    Keep BP log to bring to CPE appointment in 03/2021   Morbid Obesity  Continue diet and exercise. Encouraged fresh fruits/vegetables. Limit saturated fats and processed carbohydrates.   Further disposition pending results of labs. Discussed med's effects and SE's.   Over 20 minutes of exam, counseling, chart review, and critical decision making was performed.   Future Appointments  Date Time Provider Department Center  03/28/2021 10:00 AM Revonda Humphrey, NP [facility] None    ------------------------------------------------------------------------------------------------------------------   HPI BP 132/78   Pulse 69   Temp (!) 97.5 F (36.4 C)   Wt 246 lb 12.8 oz (111.9 kg)   SpO2 98%   BMI 42.36 kg/m  60 y.o.female presents for evaluation of hypertension.  Pt has been on HCTZ 25mg  daily  BP is running normal range at home 118-130's/70-80's.  Denies headaches, visual changes. BP Readings from Last 3 Encounters:  01/09/21 132/78  12/07/20 (!) 170/100  03/28/20 (!) 160/100    BMI is Body mass index is 42.36 kg/m., she has  been working on diet and exercise. Wt Readings from Last 3 Encounters:  01/09/21 246 lb 12.8 oz (111.9 kg)  12/07/20 248 lb (112.5 kg)  03/28/20 246 lb (111.6 kg)    Past Medical History:  Diagnosis Date   Allergy    Anxiety    History of nephrolithiasis    Hyperlipidemia    Hypertension    Prediabetes      Allergies  Allergen Reactions   Codeine Nausea Only   Penicillins     Current Outpatient Medications on File Prior to Visit  Medication Sig   aspirin 81 MG EC tablet Take 81 mg by mouth daily. Swallow whole.   BIOTIN PO Take by mouth daily.   cetirizine (ZYRTEC) 10 MG tablet Take 10 mg by mouth daily.    escitalopram (LEXAPRO) 20 MG tablet TAKE 1 TABLET BY MOUTH DAILY FOR MOOD (MUST HAVE OFFICE VISIT BEFORE NEXT REFILL)   hydrochlorothiazide (HYDRODIURIL) 25 MG tablet Take 1 tablet (25 mg total) by mouth daily.   Multiple Vitamin (MULTIVITAMIN ADULT PO) Take by mouth daily.   Probiotic Product (PROBIOTIC DAILY PO) Take by mouth.   No current facility-administered medications on file prior to visit.    ROS: all negative except above.   Physical Exam:  BP 132/78   Pulse 69   Temp (!) 97.5 F (36.4 C)   Wt 246 lb 12.8 oz (111.9 kg)   SpO2 98%   BMI 42.36 kg/m   General Appearance: Well nourished, in no apparent distress. Eyes: PERRLA, EOMs, conjunctiva no swelling or erythema Sinuses: No Frontal/maxillary tenderness ENT/Mouth: Ext aud canals clear, TMs without erythema, bulging. No erythema, swelling, or exudate on post pharynx.  Tonsils not swollen or erythematous. Hearing normal.  Neck: Supple, thyroid normal.  Respiratory: Respiratory effort normal, BS equal bilaterally without rales, rhonchi, wheezing or stridor.  Cardio: RRR with no MRGs. Brisk peripheral pulses without edema.  Abdomen: Soft, + BS.  Non tender, no guarding, rebound, hernias, masses. Lymphatics: Non tender without lymphadenopathy.  Musculoskeletal: Full ROM, 5/5 strength, normal gait.  Skin: Warm, dry without rashes, lesions, ecchymosis.  Neuro: Cranial nerves intact. Normal muscle tone, no cerebellar symptoms. Sensation intact.  Psych: Awake and oriented X 3, normal affect, Insight  and Judgment appropriate.     Revonda Humphrey, NP 10:49 AM [facility]

## 2021-01-09 ENCOUNTER — Ambulatory Visit (INDEPENDENT_AMBULATORY_CARE_PROVIDER_SITE_OTHER): Payer: 59 | Admitting: Nurse Practitioner

## 2021-01-09 ENCOUNTER — Encounter: Payer: Self-pay | Admitting: Nurse Practitioner

## 2021-01-09 ENCOUNTER — Other Ambulatory Visit: Payer: Self-pay

## 2021-01-09 VITALS — BP 132/78 | HR 69 | Temp 97.5°F | Wt 246.8 lb

## 2021-01-09 DIAGNOSIS — I10 Essential (primary) hypertension: Secondary | ICD-10-CM | POA: Diagnosis not present

## 2021-03-07 ENCOUNTER — Other Ambulatory Visit: Payer: Self-pay

## 2021-03-07 DIAGNOSIS — I10 Essential (primary) hypertension: Secondary | ICD-10-CM

## 2021-03-07 MED ORDER — HYDROCHLOROTHIAZIDE 25 MG PO TABS
25.0000 mg | ORAL_TABLET | Freq: Every day | ORAL | 3 refills | Status: DC
Start: 2021-03-07 — End: 2021-07-10

## 2021-03-20 LAB — HM MAMMOGRAPHY

## 2021-03-22 NOTE — Progress Notes (Signed)
Complete Physical  Assessment and Plan:  Encounter for general adult medical examination with abnormal findings Due yearly  Essential hypertension - Controlled with HCTZ 25 mg QD DASH diet, exercise and monitor at home. Call if greater than 130/80.  -     CBC with Differential/Platelet -     COMPLETE METABOLIC PANEL WITH GFR -     TSH -     Urinalysis, Routine w reflex microscopic -     Microalbumin / creatinine urine ratio -     EKG 12-Lead  Hyperlipidemia associated with type 2 diabetes mellitus (HCC) -     Lipid panel - declines statins and Zetia at this time - not at goal, strongly encourage diet, exercise and weight loss  Diabetes mellitus with hyperlipidemia (HCC) -     Hemoglobin A1c       - Continue diet and exercise  Anxiety Continue Celexa and behavior modifications- diet and exercise, good sleep hygiene  Morbid Obesity (HCC) - follow up 3 months for progress monitoring - increase veggies, decrease carbs - long discussion about weight loss, diet, and exercise  Vitamin D deficiency Continue supplement  Medication management -     Magnesium  Screening for blood or protein in urine - Routine UA with reflex microscopic - Micoralbumin/creatinine urine ratio  Screening for ischemic heart disease EKG  Dysuria Urine culture  Left foot pain Xray Rest and Advil as needed  Screening for Colon Cancer Cologuard  Screening for cervical cancer Pap smear taken and submitted  Flu Vaccine Need Flu Vaccine QUAD 6+ mos PF IM given  Continue diet and meds as discussed. Further disposition pending results of labs. Discussed med's effects and SE's.  Future Appointments  Date Time Provider Department Center  03/28/2022 10:00 AM Revonda Humphrey, NP [facility] None     HPI 60 y.o. female  presents for CPE and 3 month follow up with hypertension, hyperlipidemia, diabetes and vitamin D.  Her blood pressure has been controlled at home, BP's running 120- 140 /70-80,  today their BP is BP: 132/88, BP Readings from Last 3 Encounters:  03/28/21 132/88  01/09/21 132/78  12/07/20 (!) 170/100     She does workout. She denies chest pain, shortness of breath, dizziness.   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 Wt Readings from Last 3 Encounters:  03/28/21 246 lb 9.6 oz (111.9 kg)  01/09/21 246 lb 12.8 oz (111.9 kg)  12/07/20 248 lb (112.5 kg)   She is not on cholesterol medication  Her cholesterol is not at goal. Lab Results  Component Value Date   CHOL 253 (H) 12/07/2020   HDL 44 (L) 12/07/2020   LDLCALC 168 (H) 12/07/2020   TRIG 241 (H) 12/07/2020   CHOLHDL 5.8 (H) 12/07/2020   She has been working on diet and exercise for Diabetes  With hyperlipidemia- refuses statins- never started zetia Could not tolerate ozempic, states it gave her left flank pain- has history of kidney stones s/p lithotripsy- has seen Dr. Isabel Caprice in the past.  Could not tolerate jardiance due to yeast she is on bASA does not check sugars at Home denies paresthesia of the feet, polydipsia, polyuria and visual disturbances  Lab Results  Component Value Date   HGBA1C 6.7 (H) 12/07/2020   Lab Results  Component Value Date   GFRNONAA 102 03/28/2020  Patient is on Vitamin D supplement. She  is doing well on the lexapro.   She has a history of kidney stones, had episode of lower abdominal pain with blood in urine x 1 day but has resolved. Did not feel like a kidney stone. She increased water intake with lemon and symptoms have improved  She has been having pain in left foot , outer aspect top of foot x 5 months. Small swollen area on the part of left foot associated with an aching sensation . Mother had small cancerous lesions around bones of foot that required removal, pt is questioning this.  Current Medications:    Current Outpatient Medications  (Cardiovascular):    hydrochlorothiazide (HYDRODIURIL) 25 MG tablet, Take 1 tablet (25 mg total) by mouth daily.  Current Outpatient Medications (Respiratory):    cetirizine (ZYRTEC) 10 MG tablet, Take 10 mg by mouth daily.  Current Outpatient Medications (Analgesics):    aspirin 81 MG EC tablet, Take 81 mg by mouth daily. Swallow whole.   Current Outpatient Medications (Other):    BIOTIN PO, Take by mouth daily.   escitalopram (LEXAPRO) 20 MG tablet, TAKE 1 TABLET BY MOUTH DAILY FOR MOOD (MUST HAVE OFFICE VISIT BEFORE NEXT REFILL)   Multiple Vitamin (MULTIVITAMIN ADULT PO), Take by mouth daily.   Probiotic Product (PROBIOTIC DAILY PO), Take by mouth.  Medical History:  Past Medical History:  Diagnosis Date   Allergy    Anxiety    History of nephrolithiasis    Hyperlipidemia    Hypertension    Prediabetes    Preventative Medicine Immunization History  Administered Date(s) Administered   Influenza Inj Mdck Quad With Preservative 01/02/2018, 01/30/2019   Influenza Split 12/01/2013   Influenza, Seasonal, Injecte, Preservative Fre 03/02/2015   Janssen (J&J) SARS-COV-2 Vaccination 08/21/2019   Pneumococcal Polysaccharide-23 06/09/2013   Tdap 08/11/2007, 01/02/2018   Tetanus: 2019 year Pneumovax: 2015 Prevnar 13: due at age 17 Flu vaccine:DUE Zostavax: N/A  Pap: 03/28/21 3DMGM: 03/2021 negative DEXA: N/A Colonoscopy: Has not scheduled would like to do cologuard EGD: N/A  Allergies Allergies  Allergen Reactions   Codeine Nausea Only   Penicillins     SURGICAL HISTORY She  has a past surgical history that includes Tonsillectomy and adenoidectomy and Cesarean section (1995). FAMILY HISTORY Her family history includes Cancer in her father; Hypertension in her father and mother. SOCIAL HISTORY She  reports that she has quit smoking. She has never used smokeless tobacco. She reports that she does not drink alcohol and does not use drugs.  Review of Systems   Constitutional: Negative.  Negative for chills and fever.  HENT: Negative.  Negative for congestion, hearing loss, sinus pain, sore throat and tinnitus.   Eyes: Negative.  Negative for blurred vision and double vision.  Respiratory: Negative.  Negative for cough, hemoptysis, sputum production, shortness of breath and wheezing.   Cardiovascular: Negative.  Negative for chest pain, palpitations and leg swelling.  Gastrointestinal: Negative.  Negative for abdominal pain, constipation, diarrhea, heartburn, nausea and vomiting.  Genitourinary: Negative.  Negative for dysuria and urgency.  Musculoskeletal: Negative.  Negative for back pain, falls, joint pain, myalgias and neck pain.  Skin: Negative.  Negative for rash.  Neurological: Negative.  Negative for dizziness, tingling, tremors, weakness and headaches.  Endo/Heme/Allergies: Negative.  Does not bruise/bleed easily.  Psychiatric/Behavioral: Negative.  Negative for depression and suicidal ideas. The patient is not nervous/anxious and does not have insomnia.    Physical Exam: BP 132/88    Pulse 71    Temp (!) 97.3 F (36.3  C)    Ht 5\' 4"  (1.626 m)    Wt 246 lb 9.6 oz (111.9 kg)    SpO2 96%    BMI 42.33 kg/m  Wt Readings from Last 3 Encounters:  03/28/21 246 lb 9.6 oz (111.9 kg)  01/09/21 246 lb 12.8 oz (111.9 kg)  12/07/20 248 lb (112.5 kg)   General Appearance: Well nourished, in no apparent distress. Eyes: PERRLA, EOMs, conjunctiva no swelling or erythema Sinuses: No Frontal/maxillary tenderness ENT/Mouth: Ext aud canals clear, TMs without erythema, bulging. No erythema, swelling, or exudate on post pharynx.  Tonsils not swollen or erythematous. Hearing normal.  Neck: Supple, thyroid normal.  Respiratory: Respiratory effort normal, BS equal bilaterally without rales, rhonchi, wheezing or stridor.  Cardio: RRR with no MRGs. Brisk peripheral pulses without edema.  Abdomen: Soft, + BS.  Non tender, no guarding, rebound, hernias,  masses. Breasts: breasts appear normal, no suspicious masses, no skin or nipple changes or axillary nodes.  Lymphatics: Non tender without lymphadenopathy.  Musculoskeletal: Full ROM, 5/5 strength, normal gait.  Skin: Warm, dry without rashes, lesions, ecchymosis.  Neuro: Cranial nerves intact. No cerebellar symptoms. Sensation intact.  Psych: Awake and oriented X 3, normal affect, Insight and Judgment appropriate.  Pelvic exam: normal external genitalia, vulva, vagina, cervix, uterus and adnexa. Pap smear taken and submitted  EKG NSR, no ST changes  Daina Cara 02/06/21, NP 10:06 AM [facility]

## 2021-03-28 ENCOUNTER — Other Ambulatory Visit: Payer: Self-pay

## 2021-03-28 ENCOUNTER — Encounter: Payer: Self-pay | Admitting: Nurse Practitioner

## 2021-03-28 ENCOUNTER — Ambulatory Visit (INDEPENDENT_AMBULATORY_CARE_PROVIDER_SITE_OTHER): Payer: 59 | Admitting: Nurse Practitioner

## 2021-03-28 VITALS — BP 132/88 | HR 71 | Temp 97.3°F | Ht 64.0 in | Wt 246.6 lb

## 2021-03-28 DIAGNOSIS — Z124 Encounter for screening for malignant neoplasm of cervix: Secondary | ICD-10-CM

## 2021-03-28 DIAGNOSIS — E1169 Type 2 diabetes mellitus with other specified complication: Secondary | ICD-10-CM

## 2021-03-28 DIAGNOSIS — Z23 Encounter for immunization: Secondary | ICD-10-CM

## 2021-03-28 DIAGNOSIS — Z136 Encounter for screening for cardiovascular disorders: Secondary | ICD-10-CM | POA: Diagnosis not present

## 2021-03-28 DIAGNOSIS — R300 Dysuria: Secondary | ICD-10-CM

## 2021-03-28 DIAGNOSIS — Z79899 Other long term (current) drug therapy: Secondary | ICD-10-CM | POA: Diagnosis not present

## 2021-03-28 DIAGNOSIS — I10 Essential (primary) hypertension: Secondary | ICD-10-CM

## 2021-03-28 DIAGNOSIS — F419 Anxiety disorder, unspecified: Secondary | ICD-10-CM

## 2021-03-28 DIAGNOSIS — Z1211 Encounter for screening for malignant neoplasm of colon: Secondary | ICD-10-CM

## 2021-03-28 DIAGNOSIS — Z13220 Encounter for screening for lipoid disorders: Secondary | ICD-10-CM | POA: Diagnosis not present

## 2021-03-28 DIAGNOSIS — E785 Hyperlipidemia, unspecified: Secondary | ICD-10-CM

## 2021-03-28 DIAGNOSIS — Z0000 Encounter for general adult medical examination without abnormal findings: Secondary | ICD-10-CM | POA: Diagnosis not present

## 2021-03-28 DIAGNOSIS — M79672 Pain in left foot: Secondary | ICD-10-CM

## 2021-03-28 DIAGNOSIS — E559 Vitamin D deficiency, unspecified: Secondary | ICD-10-CM

## 2021-03-28 DIAGNOSIS — Z131 Encounter for screening for diabetes mellitus: Secondary | ICD-10-CM | POA: Diagnosis not present

## 2021-03-28 DIAGNOSIS — Z0001 Encounter for general adult medical examination with abnormal findings: Secondary | ICD-10-CM

## 2021-03-28 DIAGNOSIS — Z1389 Encounter for screening for other disorder: Secondary | ICD-10-CM

## 2021-03-28 NOTE — Patient Instructions (Signed)

## 2021-03-29 LAB — CBC WITH DIFFERENTIAL/PLATELET
Absolute Monocytes: 525 cells/uL (ref 200–950)
Basophils Absolute: 69 cells/uL (ref 0–200)
Basophils Relative: 0.8 %
Eosinophils Absolute: 103 cells/uL (ref 15–500)
Eosinophils Relative: 1.2 %
HCT: 45.9 % — ABNORMAL HIGH (ref 35.0–45.0)
Hemoglobin: 15.7 g/dL — ABNORMAL HIGH (ref 11.7–15.5)
Lymphs Abs: 2847 cells/uL (ref 850–3900)
MCH: 30.6 pg (ref 27.0–33.0)
MCHC: 34.2 g/dL (ref 32.0–36.0)
MCV: 89.5 fL (ref 80.0–100.0)
MPV: 9.6 fL (ref 7.5–12.5)
Monocytes Relative: 6.1 %
Neutro Abs: 5057 cells/uL (ref 1500–7800)
Neutrophils Relative %: 58.8 %
Platelets: 307 10*3/uL (ref 140–400)
RBC: 5.13 10*6/uL — ABNORMAL HIGH (ref 3.80–5.10)
RDW: 12.2 % (ref 11.0–15.0)
Total Lymphocyte: 33.1 %
WBC: 8.6 10*3/uL (ref 3.8–10.8)

## 2021-03-29 LAB — COMPLETE METABOLIC PANEL WITH GFR
AG Ratio: 1.6 (calc) (ref 1.0–2.5)
ALT: 44 U/L — ABNORMAL HIGH (ref 6–29)
AST: 31 U/L (ref 10–35)
Albumin: 4.4 g/dL (ref 3.6–5.1)
Alkaline phosphatase (APISO): 81 U/L (ref 37–153)
BUN: 10 mg/dL (ref 7–25)
CO2: 26 mmol/L (ref 20–32)
Calcium: 9.7 mg/dL (ref 8.6–10.4)
Chloride: 100 mmol/L (ref 98–110)
Creat: 0.64 mg/dL (ref 0.50–1.03)
Globulin: 2.7 g/dL (calc) (ref 1.9–3.7)
Glucose, Bld: 176 mg/dL — ABNORMAL HIGH (ref 65–99)
Potassium: 3.9 mmol/L (ref 3.5–5.3)
Sodium: 140 mmol/L (ref 135–146)
Total Bilirubin: 0.4 mg/dL (ref 0.2–1.2)
Total Protein: 7.1 g/dL (ref 6.1–8.1)
eGFR: 102 mL/min/{1.73_m2} (ref >=60)

## 2021-03-29 LAB — URINALYSIS, ROUTINE W REFLEX MICROSCOPIC
Bilirubin Urine: NEGATIVE
Glucose, UA: NEGATIVE
Hgb urine dipstick: NEGATIVE
Ketones, ur: NEGATIVE
Leukocytes,Ua: NEGATIVE
Nitrite: NEGATIVE
Protein, ur: NEGATIVE
Specific Gravity, Urine: 1.017 (ref 1.001–1.035)
pH: 6 (ref 5.0–8.0)

## 2021-03-29 LAB — URINE CULTURE
MICRO NUMBER:: 12800791
SPECIMEN QUALITY:: ADEQUATE

## 2021-03-29 LAB — LIPID PANEL
Cholesterol: 274 mg/dL — ABNORMAL HIGH (ref <200)
HDL: 47 mg/dL — ABNORMAL LOW (ref >=50)
LDL Cholesterol (Calc): 160 mg/dL (calc) — ABNORMAL HIGH
Non-HDL Cholesterol (Calc): 227 mg/dL (calc) — ABNORMAL HIGH (ref <130)
Total CHOL/HDL Ratio: 5.8 (calc) — ABNORMAL HIGH (ref <5.0)
Triglycerides: 395 mg/dL — ABNORMAL HIGH (ref <150)

## 2021-03-29 LAB — MAGNESIUM: Magnesium: 1.8 mg/dL (ref 1.5–2.5)

## 2021-03-29 LAB — HEMOGLOBIN A1C
Hgb A1c MFr Bld: 7.6 % of total Hgb — ABNORMAL HIGH (ref <5.7)
Mean Plasma Glucose: 171 mg/dL
eAG (mmol/L): 9.5 mmol/L

## 2021-03-29 LAB — TSH: TSH: 2.49 mIU/L (ref 0.40–4.50)

## 2021-03-29 LAB — MICROALBUMIN / CREATININE URINE RATIO
Creatinine, Urine: 83 mg/dL (ref 20–275)
Microalb Creat Ratio: 8 mcg/mg creat (ref <30)
Microalb, Ur: 0.7 mg/dL

## 2021-03-29 LAB — VITAMIN D 25 HYDROXY (VIT D DEFICIENCY, FRACTURES): Vit D, 25-Hydroxy: 70 ng/mL (ref 30–100)

## 2021-03-30 LAB — PAP, TP IMAGING W/ HPV RNA, RFLX HPV TYPE 16,18/45: HPV DNA High Risk: NOT DETECTED

## 2021-03-30 LAB — PAP, TP IMAGING, WNL RFLX HPV

## 2021-04-16 LAB — COLOGUARD: COLOGUARD: POSITIVE — AB

## 2021-04-17 ENCOUNTER — Encounter: Payer: Self-pay | Admitting: Nurse Practitioner

## 2021-04-17 ENCOUNTER — Other Ambulatory Visit: Payer: Self-pay | Admitting: Nurse Practitioner

## 2021-04-17 DIAGNOSIS — R195 Other fecal abnormalities: Secondary | ICD-10-CM

## 2021-04-26 ENCOUNTER — Encounter: Payer: Self-pay | Admitting: Internal Medicine

## 2021-06-04 ENCOUNTER — Other Ambulatory Visit: Payer: Self-pay | Admitting: Nurse Practitioner

## 2021-06-04 DIAGNOSIS — F419 Anxiety disorder, unspecified: Secondary | ICD-10-CM

## 2021-07-07 ENCOUNTER — Other Ambulatory Visit: Payer: Self-pay | Admitting: Nurse Practitioner

## 2021-07-07 DIAGNOSIS — I10 Essential (primary) hypertension: Secondary | ICD-10-CM

## 2021-07-17 ENCOUNTER — Ambulatory Visit: Payer: Self-pay | Admitting: Nurse Practitioner

## 2021-07-18 ENCOUNTER — Ambulatory Visit: Payer: Self-pay | Admitting: Nurse Practitioner

## 2021-12-03 ENCOUNTER — Other Ambulatory Visit: Payer: Self-pay | Admitting: Nurse Practitioner

## 2021-12-03 DIAGNOSIS — F419 Anxiety disorder, unspecified: Secondary | ICD-10-CM

## 2022-01-04 ENCOUNTER — Other Ambulatory Visit: Payer: Self-pay | Admitting: Nurse Practitioner

## 2022-01-04 DIAGNOSIS — I10 Essential (primary) hypertension: Secondary | ICD-10-CM

## 2022-03-22 LAB — HM MAMMOGRAPHY

## 2022-03-22 NOTE — Progress Notes (Deleted)
Complete Physical  Assessment and Plan:  Encounter for general adult medical examination with abnormal findings Due yearly  Essential hypertension - Controlled with HCTZ 25 mg QD DASH diet, exercise and monitor at home. Call if greater than 130/80.  -     CBC with Differential/Platelet -     COMPLETE METABOLIC PANEL WITH GFR -     TSH -     Urinalysis, Routine w reflex microscopic -     Microalbumin / creatinine urine ratio -     EKG 12-Lead  Hyperlipidemia associated with type 2 diabetes mellitus (HCC) -     Lipid panel - declines statins and Zetia at this time - not at goal, strongly encourage diet, exercise and weight loss  Diabetes mellitus with hyperlipidemia (HCC) -     Hemoglobin A1c       - Continue diet and exercise  Anxiety Continue Celexa and behavior modifications- diet and exercise, good sleep hygiene  Morbid Obesity (HCC) - follow up 3 months for progress monitoring - increase veggies, decrease carbs - long discussion about weight loss, diet, and exercise  Vitamin D deficiency Continue supplement  Medication management -     Magnesium  Screening for blood or protein in urine - Routine UA with reflex microscopic - Micoralbumin/creatinine urine ratio  Screening for ischemic heart disease EKG   Screening for Colon Cancer Cologuard    Flu Vaccine Need Flu Vaccine QUAD 6+ mos PF IM given  Continue diet and meds as discussed. Further disposition pending results of labs. Discussed med's effects and SE's.  Future Appointments  Date Time Provider Department Center  03/28/2022 10:00 AM Raynelle Dick, NP [facility] None     HPI 61 y.o. female  presents for CPE and 3 month follow up with hypertension, hyperlipidemia, diabetes and vitamin D.  Her blood pressure has been controlled at home, BP's running 120- 140 /70-80, today their BP is  , BP Readings from Last 3 Encounters:  03/28/21 132/88  01/09/21 132/78  12/07/20 (!) 170/100     She  does workout. She denies chest pain, shortness of breath, dizziness.   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 Wt Readings from Last 3 Encounters:  03/28/21 246 lb 9.6 oz (111.9 kg)  01/09/21 246 lb 12.8 oz (111.9 kg)  12/07/20 248 lb (112.5 kg)   She is not on cholesterol medication  Her cholesterol is not at goal. Lab Results  Component Value Date   CHOL 274 (H) 03/28/2021   HDL 47 (L) 03/28/2021   LDLCALC 160 (H) 03/28/2021   TRIG 395 (H) 03/28/2021   CHOLHDL 5.8 (H) 03/28/2021   She has been working on diet and exercise for Diabetes  With hyperlipidemia- refuses statins- never started zetia Could not tolerate ozempic, states it gave her left flank pain- has history of kidney stones s/p lithotripsy- has seen Dr. Isabel Caprice in the past.  Could not tolerate jardiance due to yeast she is on bASA does not check sugars at Home denies paresthesia of the feet, polydipsia, polyuria and visual disturbances  Lab Results  Component Value Date   HGBA1C 7.6 (H) 03/28/2021   Lab Results  Component Value Date   GFRNONAA 102 03/28/2020  Patient is on Vitamin D supplement. She is doing well on the lexapro.   She has a history of kidney stones, had  episode of lower abdominal pain with blood in urine x 1 day but has resolved. Did not feel like a kidney stone. She increased water intake with lemon and symptoms have improved  She has been having pain in left foot , outer aspect top of foot x 5 months. Small swollen area on the part of left foot associated with an aching sensation . Mother had small cancerous lesions around bones of foot that required removal, pt is questioning this.  Current Medications:    Current Outpatient Medications (Cardiovascular):    hydrochlorothiazide (HYDRODIURIL) 25 MG tablet, TAKE ONE TABLET BY MOUTH DAILY  Current Outpatient  Medications (Respiratory):    cetirizine (ZYRTEC) 10 MG tablet, Take 10 mg by mouth daily.  Current Outpatient Medications (Analgesics):    aspirin 81 MG EC tablet, Take 81 mg by mouth daily. Swallow whole.   Current Outpatient Medications (Other):    BIOTIN PO, Take by mouth daily.   Cholecalciferol (VITAMIN D3) 1.25 MG (50000 UT) TABS, Take by mouth.   escitalopram (LEXAPRO) 20 MG tablet, TAKE ONE TABLET BY MOUTH DAILY FOR MOOD   Multiple Vitamin (MULTIVITAMIN ADULT PO), Take by mouth daily.   Probiotic Product (PROBIOTIC DAILY PO), Take by mouth.  Medical History:  Past Medical History:  Diagnosis Date   Allergy    Anxiety    History of nephrolithiasis    Hyperlipidemia    Hypertension    Prediabetes    Preventative Medicine Immunization History  Administered Date(s) Administered   Influenza Inj Mdck Quad With Preservative 01/02/2018, 01/30/2019   Influenza Split 12/01/2013   Influenza, Seasonal, Injecte, Preservative Fre 03/02/2015   Influenza,inj,Quad PF,6+ Mos 03/28/2021   Janssen (J&J) SARS-COV-2 Vaccination 08/21/2019   Pneumococcal Polysaccharide-23 06/09/2013   Tdap 08/11/2007, 01/02/2018   Tetanus: 2019 year Pneumovax: 2015 Prevnar 13: due at age 64 Flu vaccine:DUE Zostavax: N/A  Pap: 03/28/21 3DMGM: 03/2021 negative DEXA: N/A Colonoscopy: Has not scheduled would like to do cologuard EGD: N/A  Allergies Allergies  Allergen Reactions   Codeine Nausea Only   Penicillins     SURGICAL HISTORY She  has a past surgical history that includes Tonsillectomy and adenoidectomy and Cesarean section (1995). FAMILY HISTORY Her family history includes Cancer in her father; Hypertension in her father and mother. SOCIAL HISTORY She  reports that she has quit smoking. She has never used smokeless tobacco. She reports that she does not drink alcohol and does not use drugs.  Review of Systems  Constitutional: Negative.  Negative for chills and fever.  HENT:  Negative.  Negative for congestion, hearing loss, sinus pain, sore throat and tinnitus.   Eyes: Negative.  Negative for blurred vision and double vision.  Respiratory: Negative.  Negative for cough, hemoptysis, sputum production, shortness of breath and wheezing.   Cardiovascular: Negative.  Negative for chest pain, palpitations and leg swelling.  Gastrointestinal: Negative.  Negative for abdominal pain, constipation, diarrhea, heartburn, nausea and vomiting.  Genitourinary: Negative.  Negative for dysuria and urgency.  Musculoskeletal: Negative.  Negative for back pain, falls, joint pain, myalgias and neck pain.  Skin: Negative.  Negative for rash.  Neurological: Negative.  Negative for dizziness, tingling, tremors, weakness and headaches.  Endo/Heme/Allergies: Negative.  Does not bruise/bleed easily.  Psychiatric/Behavioral: Negative.  Negative for depression and suicidal ideas. The patient is not nervous/anxious and does not have insomnia.     Physical Exam: There were no vitals taken for this visit. Wt Readings from Last 3 Encounters:  03/28/21 246 lb 9.6 oz (111.9  kg)  01/09/21 246 lb 12.8 oz (111.9 kg)  12/07/20 248 lb (112.5 kg)   General Appearance: Well nourished, in no apparent distress. Eyes: PERRLA, EOMs, conjunctiva no swelling or erythema Sinuses: No Frontal/maxillary tenderness ENT/Mouth: Ext aud canals clear, TMs without erythema, bulging. No erythema, swelling, or exudate on post pharynx.  Tonsils not swollen or erythematous. Hearing normal.  Neck: Supple, thyroid normal.  Respiratory: Respiratory effort normal, BS equal bilaterally without rales, rhonchi, wheezing or stridor.  Cardio: RRR with no MRGs. Brisk peripheral pulses without edema.  Abdomen: Soft, + BS.  Non tender, no guarding, rebound, hernias, masses. Breasts: breasts appear normal, no suspicious masses, no skin or nipple changes or axillary nodes.  Lymphatics: Non tender without lymphadenopathy.   Musculoskeletal: Full ROM, 5/5 strength, normal gait.  Skin: Warm, dry without rashes, lesions, ecchymosis.  Neuro: Cranial nerves intact. No cerebellar symptoms. Sensation intact.  Psych: Awake and oriented X 3, normal affect, Insight and Judgment appropriate.  Pelvic exam: normal external genitalia, vulva, vagina, cervix, uterus and adnexa. Pap smear taken and submitted  EKG NSR, no ST changes  Jahmil Macleod Hollie Salk, NP 9:56 AM [facility]

## 2022-03-28 ENCOUNTER — Encounter: Payer: Self-pay | Admitting: Nurse Practitioner

## 2022-03-28 DIAGNOSIS — F419 Anxiety disorder, unspecified: Secondary | ICD-10-CM

## 2022-03-28 DIAGNOSIS — Z0001 Encounter for general adult medical examination with abnormal findings: Secondary | ICD-10-CM

## 2022-03-28 DIAGNOSIS — Z1389 Encounter for screening for other disorder: Secondary | ICD-10-CM

## 2022-03-28 DIAGNOSIS — Z136 Encounter for screening for cardiovascular disorders: Secondary | ICD-10-CM

## 2022-03-28 DIAGNOSIS — Z79899 Other long term (current) drug therapy: Secondary | ICD-10-CM

## 2022-03-28 DIAGNOSIS — E559 Vitamin D deficiency, unspecified: Secondary | ICD-10-CM

## 2022-03-28 DIAGNOSIS — Z1329 Encounter for screening for other suspected endocrine disorder: Secondary | ICD-10-CM

## 2022-03-28 DIAGNOSIS — I10 Essential (primary) hypertension: Secondary | ICD-10-CM

## 2022-03-28 DIAGNOSIS — E1169 Type 2 diabetes mellitus with other specified complication: Secondary | ICD-10-CM

## 2022-03-29 ENCOUNTER — Encounter: Payer: Self-pay | Admitting: Internal Medicine

## 2022-04-16 ENCOUNTER — Encounter: Payer: Self-pay | Admitting: Nurse Practitioner

## 2022-04-16 ENCOUNTER — Ambulatory Visit (INDEPENDENT_AMBULATORY_CARE_PROVIDER_SITE_OTHER): Payer: BC Managed Care – PPO | Admitting: Nurse Practitioner

## 2022-04-16 VITALS — BP 152/92 | HR 73 | Temp 97.5°F | Ht 64.0 in | Wt 236.0 lb

## 2022-04-16 DIAGNOSIS — I10 Essential (primary) hypertension: Secondary | ICD-10-CM | POA: Diagnosis not present

## 2022-04-16 DIAGNOSIS — I700 Atherosclerosis of aorta: Secondary | ICD-10-CM

## 2022-04-16 DIAGNOSIS — Z136 Encounter for screening for cardiovascular disorders: Secondary | ICD-10-CM

## 2022-04-16 DIAGNOSIS — E1169 Type 2 diabetes mellitus with other specified complication: Secondary | ICD-10-CM

## 2022-04-16 DIAGNOSIS — E559 Vitamin D deficiency, unspecified: Secondary | ICD-10-CM | POA: Diagnosis not present

## 2022-04-16 DIAGNOSIS — Z131 Encounter for screening for diabetes mellitus: Secondary | ICD-10-CM

## 2022-04-16 DIAGNOSIS — Z1389 Encounter for screening for other disorder: Secondary | ICD-10-CM | POA: Diagnosis not present

## 2022-04-16 DIAGNOSIS — Z87442 Personal history of urinary calculi: Secondary | ICD-10-CM

## 2022-04-16 DIAGNOSIS — Z0000 Encounter for general adult medical examination without abnormal findings: Secondary | ICD-10-CM | POA: Diagnosis not present

## 2022-04-16 DIAGNOSIS — Z13220 Encounter for screening for lipoid disorders: Secondary | ICD-10-CM

## 2022-04-16 DIAGNOSIS — Z79899 Other long term (current) drug therapy: Secondary | ICD-10-CM | POA: Diagnosis not present

## 2022-04-16 DIAGNOSIS — F419 Anxiety disorder, unspecified: Secondary | ICD-10-CM

## 2022-04-16 DIAGNOSIS — Z1329 Encounter for screening for other suspected endocrine disorder: Secondary | ICD-10-CM

## 2022-04-16 DIAGNOSIS — Z0001 Encounter for general adult medical examination with abnormal findings: Secondary | ICD-10-CM

## 2022-04-16 NOTE — Progress Notes (Signed)
Complete Physical  Assessment and Plan:  Encounter for general adult medical examination with abnormal findings Due yearly  Essential hypertension - Controlled with HCTZ 25 mg QD DASH diet, exercise and monitor at home. Call if greater than 130/80.  -     CBC with Differential/Platelet -     COMPLETE METABOLIC PANEL WITH GFR -     TSH -     Urinalysis, Routine w reflex microscopic -     Microalbumin / creatinine urine ratio -     EKG 12-Lead  Hyperlipidemia associated with type 2 diabetes mellitus (HCC) -     Lipid panel - declines statins and Zetia at this time - not at goal, strongly encourage diet, exercise and weight loss  Diabetes mellitus with hyperlipidemia (HCC) -     Hemoglobin A1c       - Continue diet and exercise, will re discuss Zetia if remains elevated  If A1c remains greater than 7 will discuss initiation of Metfromin  Anxiety Continue Celexa and behavior modifications- diet and exercise, good sleep hygiene  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  Increase protein and exercise - long discussion about weight loss, diet, and exercise  Vitamin D deficiency Continue supplement  Medication management -     Magnesium  History of kidney Stones Continue to monitor  Screening for blood or protein in urine - Routine UA with reflex microscopic - Micoralbumin/creatinine urine ratio  Screening for ischemic heart disease EKG  Screening for AAA - ABD U/S Retroperitoneal LTD  Screening for thyroid - TSH  Continue diet and meds as discussed. Further disposition pending results of labs. Discussed med's effects and SE's.  Future Appointments  Date Time Provider [place]  04/17/2023  3:00 PM Alycia Rossetti, NP [facility] None     HPI 62 y.o. female  presents for CPE and 3 month follow up with hypertension, hyperlipidemia, diabetes and vitamin  D.  Her blood pressure has been controlled at home, BP's running 120- 140 /70-90, today their BP is BP: (!) 152/92, She is currently on HCTZ 25 mg QD.  BP Readings from Last 3 Encounters:  04/16/22 (!) 152/92  03/28/21 132/88  01/09/21 132/78  She does workout. She denies chest pain, shortness of breath, dizziness.   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 She is doing [place] everyday for exercise.  Limiting saturated fats and simple carbs.  Wt Readings from Last 3 Encounters:  04/16/22 236 lb (107 kg)  03/28/21 246 lb 9.6 oz (111.9 kg)  01/09/21 246 lb 12.8 oz (111.9 kg)   She is not on cholesterol medication  Her cholesterol is not at goal. Lab Results  Component Value Date   CHOL 274 (H) 03/28/2021   HDL 47 (L) 03/28/2021   LDLCALC 160 (H) 03/28/2021   TRIG 395 (H) 03/28/2021   CHOLHDL 5.8 (H) 03/28/2021   She has been working on diet and exercise for Diabetes  With hyperlipidemia- refuses statins- never started zetia Could not tolerate ozempic, states it gave her left flank pain- has history of kidney stones s/p lithotripsy- has seen Dr. Risa Grill in the past.  Could not tolerate jardiance due to yeast she is on bASA does not check sugars at Home denies paresthesia of the feet, polydipsia, polyuria and visual  disturbances  Lab Results  Component Value Date   HGBA1C 7.6 (H) 03/28/2021   She is drinking lots of water Lab Results  Component Value Date   EGFR 102 03/28/2021   Patient is on Vitamin D supplement. Lab Results  Component Value Date   VD25OH 106 03/28/2021      She is doing well on the lexapro.   She has a history of kidney stones, did go to beach towing [employer]th wheel and has some hematuria but resolved when she got home .    Current Medications:    Current Outpatient Medications (Cardiovascular):    hydrochlorothiazide (HYDRODIURIL) 25 MG tablet,  TAKE ONE TABLET BY MOUTH DAILY  Current Outpatient Medications (Respiratory):    cetirizine (ZYRTEC) 10 MG tablet, Take 10 mg by mouth daily.  Current Outpatient Medications (Analgesics):    aspirin 81 MG EC tablet, Take 81 mg by mouth daily. Swallow whole.   Current Outpatient Medications (Other):    BIOTIN PO, Take by mouth daily.   Cholecalciferol (VITAMIN D3) 1.25 MG (50000 UT) TABS, Take by mouth.   escitalopram (LEXAPRO) 20 MG tablet, TAKE ONE TABLET BY MOUTH DAILY FOR MOOD   LYSINE PO, Take by mouth.   Multiple Vitamin (MULTIVITAMIN ADULT PO), Take by mouth daily.   Probiotic Product (PROBIOTIC DAILY PO), Take by mouth.  Medical History:  Past Medical History:  Diagnosis Date   Allergy    Anxiety    History of nephrolithiasis    Hyperlipidemia    Hypertension    Prediabetes    Preventative Medicine Immunization History  Administered Date(s) Administered   Influenza Inj Mdck Quad With Preservative 01/02/2018, 01/30/2019   Influenza Split 12/01/2013   Influenza, Seasonal, Injecte, Preservative Fre 03/02/2015   Influenza,inj,Quad PF,6+ Mos 03/28/2021   Influenza-Unspecified 02/13/2022   Janssen (J&J) SARS-COV-2 Vaccination 08/21/2019   Pneumococcal Polysaccharide-23 06/09/2013   Tdap 08/11/2007, 01/02/2018   Tetanus: 2019 year Pneumovax: 2015 Prevnar 13: due at age 59 Flu vaccine:DUE Zostavax: N/A  Pap: 03/28/21 3DMGM: 03/2021 negative DEXA: N/A Colonoscopy: Has not scheduled would like to do cologuard EGD: N/A  Allergies Allergies  Allergen Reactions   Codeine Nausea Only   Penicillins     SURGICAL HISTORY She  has a past surgical history that includes Tonsillectomy and adenoidectomy and Cesarean section (1995). FAMILY HISTORY Her family history includes Cancer in her father; Hypertension in her father and mother. SOCIAL HISTORY She  reports that she has quit smoking. She has never used smokeless tobacco. She reports that she does not drink alcohol  and does not use drugs.  Review of Systems  Constitutional: Negative.  Negative for chills and fever.  HENT: Negative.  Negative for congestion, hearing loss, sinus pain, sore throat and tinnitus.   Eyes: Negative.  Negative for blurred vision and double vision.  Respiratory: Negative.  Negative for cough, hemoptysis, sputum production, shortness of breath and wheezing.   Cardiovascular: Negative.  Negative for chest pain, palpitations and leg swelling.  Gastrointestinal: Negative.  Negative for abdominal pain, constipation, diarrhea, heartburn, nausea and vomiting.  Genitourinary: Negative.  Negative for dysuria and urgency.  Musculoskeletal: Negative.  Negative for back pain, falls, joint pain, myalgias and neck pain.  Skin: Negative.  Negative for rash.  Neurological: Negative.  Negative for dizziness, tingling, tremors, weakness and headaches.  Endo/Heme/Allergies: Negative.  Does not bruise/bleed easily.  Psychiatric/Behavioral: Negative.  Negative for depression and suicidal ideas. The patient is not nervous/anxious and does not have insomnia.  Physical Exam: BP (!) 152/92   Pulse 73   Temp (!) 97.5 F (36.4 C)   Ht 5\' 4"  (1.626 m)   Wt 236 lb (107 kg)   SpO2 97%   BMI 40.51 kg/m  Wt Readings from Last 3 Encounters:  04/16/22 236 lb (107 kg)  03/28/21 246 lb 9.6 oz (111.9 kg)  01/09/21 246 lb 12.8 oz (111.9 kg)   General Appearance: Well nourished, in no apparent distress. Eyes: PERRLA, EOMs, conjunctiva no swelling or erythema Sinuses: No Frontal/maxillary tenderness ENT/Mouth: Ext aud canals clear, TMs without erythema, bulging. No erythema, swelling, or exudate on post pharynx.  Hearing normal.  Neck: Supple, thyroid normal.  Respiratory: Respiratory effort normal, BS equal bilaterally without rales, rhonchi, wheezing or stridor.  Cardio: RRR with no MRGs. Brisk peripheral pulses without edema.  Abdomen: Soft, + BS.  Non tender, no guarding, rebound, hernias,  masses. Breasts:mammo UTD Lymphatics: Non tender without lymphadenopathy.  Musculoskeletal: Full ROM, 5/5 strength, normal gait.  Skin: Warm, dry without rashes, lesions, ecchymosis.  Neuro: Cranial nerves intact. No cerebellar symptoms. Sensation intact.  Psych: Awake and oriented X 3, normal affect, Insight and Judgment appropriate.   AAA: < 3 cm EKG NSR, no ST changes  Eura Radabaugh 03/11/21, NP 3:17 PM [facility]

## 2022-04-16 NOTE — Patient Instructions (Signed)

## 2022-04-18 LAB — URINALYSIS W MICROSCOPIC + REFLEX CULTURE
Bacteria, UA: NONE SEEN /HPF
Bilirubin Urine: NEGATIVE
Glucose, UA: NEGATIVE
Hgb urine dipstick: NEGATIVE
Hyaline Cast: NONE SEEN /LPF
Ketones, ur: NEGATIVE
Nitrites, Initial: NEGATIVE
Protein, ur: NEGATIVE
RBC / HPF: NONE SEEN /HPF (ref 0–2)
Specific Gravity, Urine: 1.007 (ref 1.001–1.035)
Squamous Epithelial / HPF: NONE SEEN /HPF (ref <=5)
pH: 6 (ref 5.0–8.0)

## 2022-04-18 LAB — CBC WITH DIFFERENTIAL/PLATELET
Absolute Monocytes: 473 cells/uL (ref 200–950)
Basophils Absolute: 58 cells/uL (ref 0–200)
Basophils Relative: 0.7 %
Eosinophils Absolute: 100 cells/uL (ref 15–500)
Eosinophils Relative: 1.2 %
HCT: 46.6 % — ABNORMAL HIGH (ref 35.0–45.0)
Hemoglobin: 15.6 g/dL — ABNORMAL HIGH (ref 11.7–15.5)
Lymphs Abs: 4067 cells/uL — ABNORMAL HIGH (ref 850–3900)
MCH: 29.9 pg (ref 27.0–33.0)
MCHC: 33.5 g/dL (ref 32.0–36.0)
MCV: 89.4 fL (ref 80.0–100.0)
MPV: 9.8 fL (ref 7.5–12.5)
Monocytes Relative: 5.7 %
Neutro Abs: 3602 cells/uL (ref 1500–7800)
Neutrophils Relative %: 43.4 %
Platelets: 308 10*3/uL (ref 140–400)
RBC: 5.21 10*6/uL — ABNORMAL HIGH (ref 3.80–5.10)
RDW: 11.9 % (ref 11.0–15.0)
Total Lymphocyte: 49 %
WBC: 8.3 10*3/uL (ref 3.8–10.8)

## 2022-04-18 LAB — COMPLETE METABOLIC PANEL WITH GFR
AG Ratio: 1.5 (calc) (ref 1.0–2.5)
ALT: 23 U/L (ref 6–29)
AST: 18 U/L (ref 10–35)
Albumin: 4.3 g/dL (ref 3.6–5.1)
Alkaline phosphatase (APISO): 71 U/L (ref 37–153)
BUN: 12 mg/dL (ref 7–25)
CO2: 27 mmol/L (ref 20–32)
Calcium: 10.1 mg/dL (ref 8.6–10.4)
Chloride: 99 mmol/L (ref 98–110)
Creat: 0.65 mg/dL (ref 0.50–1.03)
Globulin: 2.9 g/dL (calc) (ref 1.9–3.7)
Glucose, Bld: 128 mg/dL — ABNORMAL HIGH (ref 65–99)
Potassium: 3.9 mmol/L (ref 3.5–5.3)
Sodium: 139 mmol/L (ref 135–146)
Total Bilirubin: 0.3 mg/dL (ref 0.2–1.2)
Total Protein: 7.2 g/dL (ref 6.1–8.1)
eGFR: 101 mL/min/{1.73_m2} (ref >=60)

## 2022-04-18 LAB — MICROALBUMIN / CREATININE URINE RATIO
Creatinine, Urine: 42 mg/dL (ref 20–275)
Microalb Creat Ratio: 24 mcg/mg creat (ref <30)
Microalb, Ur: 1 mg/dL

## 2022-04-18 LAB — LIPID PANEL
Cholesterol: 263 mg/dL — ABNORMAL HIGH (ref <200)
HDL: 49 mg/dL — ABNORMAL LOW (ref >=50)
LDL Cholesterol (Calc): 164 mg/dL (calc) — ABNORMAL HIGH
Non-HDL Cholesterol (Calc): 214 mg/dL (calc) — ABNORMAL HIGH (ref <130)
Total CHOL/HDL Ratio: 5.4 (calc) — ABNORMAL HIGH (ref <5.0)
Triglycerides: 325 mg/dL — ABNORMAL HIGH (ref <150)

## 2022-04-18 LAB — TSH: TSH: 3.6 mIU/L (ref 0.40–4.50)

## 2022-04-18 LAB — HEMOGLOBIN A1C
Hgb A1c MFr Bld: 7.8 % of total Hgb — ABNORMAL HIGH (ref <5.7)
Mean Plasma Glucose: 177 mg/dL
eAG (mmol/L): 9.8 mmol/L

## 2022-04-18 LAB — URINE CULTURE
MICRO NUMBER:: 14434177
SPECIMEN QUALITY:: ADEQUATE

## 2022-04-18 LAB — CULTURE INDICATED

## 2022-04-18 LAB — MAGNESIUM: Magnesium: 1.9 mg/dL (ref 1.5–2.5)

## 2022-04-18 LAB — VITAMIN D 25 HYDROXY (VIT D DEFICIENCY, FRACTURES): Vit D, 25-Hydroxy: 37 ng/mL (ref 30–100)

## 2022-05-31 ENCOUNTER — Other Ambulatory Visit: Payer: Self-pay | Admitting: Nurse Practitioner

## 2022-05-31 DIAGNOSIS — F419 Anxiety disorder, unspecified: Secondary | ICD-10-CM

## 2022-06-30 ENCOUNTER — Other Ambulatory Visit: Payer: Self-pay | Admitting: Nurse Practitioner

## 2022-06-30 DIAGNOSIS — I10 Essential (primary) hypertension: Secondary | ICD-10-CM

## 2022-08-01 ENCOUNTER — Ambulatory Visit: Payer: BC Managed Care – PPO | Admitting: Nurse Practitioner

## 2022-09-05 ENCOUNTER — Ambulatory Visit: Payer: BC Managed Care – PPO | Admitting: Nurse Practitioner

## 2022-11-14 ENCOUNTER — Ambulatory Visit (INDEPENDENT_AMBULATORY_CARE_PROVIDER_SITE_OTHER): Payer: BC Managed Care – PPO | Admitting: Nurse Practitioner

## 2022-11-14 ENCOUNTER — Encounter: Payer: Self-pay | Admitting: Nurse Practitioner

## 2022-11-14 VITALS — BP 144/86 | HR 71 | Temp 97.5°F | Ht 64.0 in | Wt 235.6 lb

## 2022-11-14 DIAGNOSIS — E1169 Type 2 diabetes mellitus with other specified complication: Secondary | ICD-10-CM

## 2022-11-14 DIAGNOSIS — E785 Hyperlipidemia, unspecified: Secondary | ICD-10-CM | POA: Diagnosis not present

## 2022-11-14 DIAGNOSIS — F419 Anxiety disorder, unspecified: Secondary | ICD-10-CM

## 2022-11-14 DIAGNOSIS — I10 Essential (primary) hypertension: Secondary | ICD-10-CM

## 2022-11-14 DIAGNOSIS — E559 Vitamin D deficiency, unspecified: Secondary | ICD-10-CM | POA: Diagnosis not present

## 2022-11-14 DIAGNOSIS — Z79899 Other long term (current) drug therapy: Secondary | ICD-10-CM

## 2022-11-14 MED ORDER — LOSARTAN POTASSIUM 25 MG PO TABS: 25.0000 mg | ORAL_TABLET | Freq: Every day | ORAL | 11 refills | Status: DC

## 2022-11-14 MED ORDER — SEMAGLUTIDE(0.25 OR 0.5MG/DOS) 2 MG/3ML ~~LOC~~ SOPN: 0.2500 mg | PEN_INJECTOR | SUBCUTANEOUS | 1 refills | Status: DC

## 2022-11-14 NOTE — Progress Notes (Signed)
6 MONTH Follow UP  Assessment and Plan:   Essential hypertension -Start Losartan 25 mg 1 tab daily along with hydrochlorothiazide daily for blood pressure control -Follow up in 4 weeks - bring BP log of results at home once a day and your BP cuff to compare results. - Continue DASH diet, exercise and monitor at home. Call if greater than 130/80.  Go to the ER if any chest pain, shortness of breath, nausea, dizziness, severe HA, changes vision/speech  -     CBC with Differential/Platelet -     COMPLETE METABOLIC PANEL WITH GFR   Hyperlipidemia associated with type 2 diabetes mellitus (HCC) -     Lipid panel - declines statins and Zetia at this time - not at goal, strongly encourage diet, exercise and weight loss  Diabetes mellitus with hyperlipidemia (HCC) -     Hemoglobin A1c       - Continue diet and exercise, weight loss declines statins and zetia  Begin Ozempic 0.25 mg and if no side effects increase to 0.5 mg qd  Anxiety Continue Lexapro 20 mg every day  and behavior modifications- diet and exercise, good sleep hygiene  Type 2 Diabetes Mellitus with Morbid Obesity (HCC) Fair life protein shakes Eat more frequently - try not to go more than 6 hours without protein Aim for 90 grams of protein a day- 30 breakfast/30 lunch 30 dinner Try to keep net carbs less than 50 Net Carbs=Total Carbs-fiber- sugar alcohols Exercise heartrate 120-140(fat burning zone)- walking 20-30 minutes 4 days a week  Begin Ozempic 0.25 mg qw and increase to 0.5 mg QW if no side effects  Vitamin D deficiency Continue supplement  Medication management -     Magnesium    Continue diet and meds as discussed. Further disposition pending results of labs. Discussed med's effects and SE's.  Future Appointments  Date Time Provider Department Center  04/17/2023  3:00 PM Raynelle Dick, NP [facility] None     HPI 62 y.o. female  presents for CPE and 3 month follow up with hypertension,  hyperlipidemia, diabetes and vitamin D.  Her blood pressure has been controlled at home, BP's running 140's/60's, today their BP is BP: (!) 144/86, She is currently on HCTZ 25 mg QD.  BP Readings from Last 3 Encounters:  11/14/22 (!) 144/86  04/16/22 (!) 152/92  03/28/21 132/88  She does workout. She denies chest pain, shortness of breath, dizziness.   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  BMI is Body mass index is 40.44 kg/m., she is working on diet and exercise. She is doing [place] everyday and 5-10 minutes on elliptical for exercise.  Limiting saturated fats and simple carbs.  Wt Readings from Last 3 Encounters:  11/14/22 235 lb 9.6 oz (106.9 kg)  04/16/22 236 lb (107 kg)  03/28/21 246 lb 9.6 oz (111.9 kg)   She is not on cholesterol medication  She eats very little dairy, eggs, red meat. She does not eat fried foods. Her cholesterol is not at goal. Lab Results  Component Value Date   CHOL 263 (H) 04/16/2022   HDL 49 (L) 04/16/2022   LDLCALC 164 (H) 04/16/2022   TRIG 325 (H) 04/16/2022   CHOLHDL 5.4 (H) 04/16/2022   She has been working on diet and exercise for Diabetes  With hyperlipidemia- refuses statins- never started zetia Could not tolerate jardiance due to yeast Had severe diarrhea with Metformin she is on bASA does  not check sugars at Home denies paresthesia of the feet, polydipsia, polyuria and visual disturbances  Lab Results  Component Value Date   HGBA1C 7.8 (H) 04/16/2022   She is drinking lots of water Lab Results  Component Value Date   EGFR 101 04/16/2022   Patient is on Vitamin D supplement. Lab Results  Component Value Date   VD25OH 37 04/16/2022      She is doing well on the lexapro 20 mg every day , anxiety symptoms are controlled  She has a history of kidney stones, she has kidney stones in left kidney but currently asymptomatic.    Current Medications:   Current Outpatient Medications  (Endocrine & Metabolic):    Semaglutide,0.25 or 0.5MG /DOS, 2 MG/3ML SOPN, Inject 0.25 mg into the skin once a week.  Current Outpatient Medications (Cardiovascular):    hydrochlorothiazide (HYDRODIURIL) 25 MG tablet, TAKE 1 TABLET BY MOUTH DAILY   losartan (COZAAR) 25 MG tablet, Take 1 tablet (25 mg total) by mouth daily.  Current Outpatient Medications (Respiratory):    cetirizine (ZYRTEC) 10 MG tablet, Take 10 mg by mouth daily.  Current Outpatient Medications (Analgesics):    aspirin 81 MG EC tablet, Take 81 mg by mouth daily. Swallow whole.   Current Outpatient Medications (Other):    CITRUS BERGAMOT PO, Take 1,000 mg by mouth.   escitalopram (LEXAPRO) 20 MG tablet, TAKE 1 TABLET BY MOUTH DAILY FOR MOOD   LYSINE PO, Take by mouth. Only when she has a fever blister   Multiple Vitamin (MULTIVITAMIN ADULT PO), Take by mouth daily. With extra D3   Probiotic Product (PROBIOTIC DAILY PO), Take by mouth.   BIOTIN PO, Take by mouth daily. (Patient not taking: Reported on 11/14/2022)   Cholecalciferol (VITAMIN D3) 1.25 MG (50000 UT) TABS, Take by mouth. (Patient not taking: Reported on 11/14/2022)  Medical History:  Past Medical History:  Diagnosis Date   Allergy    Anxiety    History of nephrolithiasis    Hyperlipidemia    Hypertension    Prediabetes      Allergies Allergies  Allergen Reactions   Codeine Nausea Only   Penicillins     SURGICAL HISTORY She  has a past surgical history that includes Tonsillectomy and adenoidectomy and Cesarean section (1995). FAMILY HISTORY Her family history includes Cancer in her father; Hypertension in her father and mother. SOCIAL HISTORY She  reports that she has quit smoking. She has never used smokeless tobacco. She reports that she does not drink alcohol and does not use drugs.  Review of Systems  Constitutional: Negative.  Negative for chills and fever.  HENT: Negative.  Negative for congestion, hearing loss, sinus pain, sore throat  and tinnitus.   Eyes: Negative.  Negative for blurred vision and double vision.  Respiratory: Negative.  Negative for cough, hemoptysis, sputum production, shortness of breath and wheezing.   Cardiovascular: Negative.  Negative for chest pain, palpitations and leg swelling.  Gastrointestinal: Negative.  Negative for abdominal pain, constipation, diarrhea, heartburn, nausea and vomiting.  Genitourinary: Negative.  Negative for dysuria and urgency.  Musculoskeletal: Negative.  Negative for back pain, falls, joint pain, myalgias and neck pain.  Skin: Negative.  Negative for rash.  Neurological: Negative.  Negative for dizziness, tingling, tremors, weakness and headaches.  Endo/Heme/Allergies: Negative.  Does not bruise/bleed easily.  Psychiatric/Behavioral: Negative.  Negative for depression and suicidal ideas. The patient is not nervous/anxious and does not have insomnia.     Physical Exam: BP (!) 144/86  Pulse 71   Temp (!) 97.5 F (36.4 C)   Ht 5\' 4"  (1.626 m)   Wt 235 lb 9.6 oz (106.9 kg)   SpO2 96%   BMI 40.44 kg/m  Wt Readings from Last 3 Encounters:  11/14/22 235 lb 9.6 oz (106.9 kg)  04/16/22 236 lb (107 kg)  03/28/21 246 lb 9.6 oz (111.9 kg)   General Appearance: very pleasant obese female in no apparent distress Eyes: PERRLA, EOMs, conjunctiva no swelling or erythema ENT/Mouth: Ext aud canals clear, TMs without erythema, bulging. No erythema, swelling, or exudate on post pharynx.  Hearing normal.  Neck: Supple, thyroid normal.  Respiratory: Respiratory effort normal, BS equal bilaterally without rales, rhonchi, wheezing or stridor.  Cardio: RRR with no MRGs. Brisk peripheral pulses without edema.  Abdomen: Soft, + BS.  Non tender, no guarding, rebound, hernias, masses. Lymphatics: Non tender without lymphadenopathy.  Musculoskeletal: Full ROM, 5/5 strength, normal gait.  Skin: Warm, dry without rashes, lesions, ecchymosis.  Neuro: Cranial nerves intact. No cerebellar  symptoms. Sensation intact.  Psych: Awake and oriented X 3, normal affect, Insight and Judgment appropriate.     Raynelle Dick, NP 12:17 PM [facility]

## 2022-11-14 NOTE — Patient Instructions (Addendum)
Fair life protein shakes Eat more frequently - try not to go more than 6 hours without protein Aim for 90 grams of protein a day- 30 breakfast/30 lunch 30 dinner Try to keep net carbs less than 50 Net Carbs=Total Carbs-fiber- sugar alcohols Exercise heartrate 120-140(fat burning zone)- walking 20-30 minutes 4 days a week   Start Ozempic once a week, start at 0.25 mg and if no side effects increase to 0.5 mg weekly  Start Losartan 25 mg 1 tab daily along with hydrochlorothiazide daily for blood pressure control Follow up in 4 weeks - bring BP log of results at home once a day and your BP cuff to compare results.

## 2022-11-15 LAB — CBC WITH DIFFERENTIAL/PLATELET
Absolute Monocytes: 521 {cells}/uL (ref 200–950)
Basophils Absolute: 63 {cells}/uL (ref 0–200)
Basophils Relative: 0.8 %
Eosinophils Absolute: 87 {cells}/uL (ref 15–500)
Eosinophils Relative: 1.1 %
HCT: 46.6 % — ABNORMAL HIGH (ref 35.0–45.0)
Hemoglobin: 15.6 g/dL — ABNORMAL HIGH (ref 11.7–15.5)
Lymphs Abs: 3057 {cells}/uL (ref 850–3900)
MCH: 30.1 pg (ref 27.0–33.0)
MCHC: 33.5 g/dL (ref 32.0–36.0)
MCV: 89.8 fL (ref 80.0–100.0)
MPV: 9.8 fL (ref 7.5–12.5)
Monocytes Relative: 6.6 %
Neutro Abs: 4171 {cells}/uL (ref 1500–7800)
Neutrophils Relative %: 52.8 %
Platelets: 317 10*3/uL (ref 140–400)
RBC: 5.19 10*6/uL — ABNORMAL HIGH (ref 3.80–5.10)
RDW: 12.1 % (ref 11.0–15.0)
Total Lymphocyte: 38.7 %
WBC: 7.9 10*3/uL (ref 3.8–10.8)

## 2022-11-15 LAB — LIPID PANEL
Cholesterol: 255 mg/dL — ABNORMAL HIGH (ref <200)
HDL: 46 mg/dL — ABNORMAL LOW (ref >=50)
LDL Cholesterol (Calc): 164 mg/dL — ABNORMAL HIGH
Non-HDL Cholesterol (Calc): 209 mg/dL — ABNORMAL HIGH (ref <130)
Total CHOL/HDL Ratio: 5.5 (calc) — ABNORMAL HIGH (ref <5.0)
Triglycerides: 295 mg/dL — ABNORMAL HIGH (ref <150)

## 2022-11-15 LAB — HEMOGLOBIN A1C W/OUT EAG: Hgb A1c MFr Bld: 7 %{Hb} — ABNORMAL HIGH (ref <5.7)

## 2022-11-15 LAB — COMPLETE METABOLIC PANEL WITH GFR
AG Ratio: 1.8 (calc) (ref 1.0–2.5)
ALT: 33 U/L — ABNORMAL HIGH (ref 6–29)
AST: 24 U/L (ref 10–35)
Albumin: 4.6 g/dL (ref 3.6–5.1)
Alkaline phosphatase (APISO): 71 U/L (ref 37–153)
BUN: 12 mg/dL (ref 7–25)
CO2: 29 mmol/L (ref 20–32)
Calcium: 9.7 mg/dL (ref 8.6–10.4)
Chloride: 99 mmol/L (ref 98–110)
Creat: 0.6 mg/dL (ref 0.50–1.05)
Globulin: 2.6 g/dL (ref 1.9–3.7)
Glucose, Bld: 152 mg/dL — ABNORMAL HIGH (ref 65–99)
Potassium: 4.1 mmol/L (ref 3.5–5.3)
Sodium: 139 mmol/L (ref 135–146)
Total Bilirubin: 0.4 mg/dL (ref 0.2–1.2)
Total Protein: 7.2 g/dL (ref 6.1–8.1)
eGFR: 103 mL/min/{1.73_m2} (ref >=60)

## 2022-11-20 ENCOUNTER — Other Ambulatory Visit: Payer: Self-pay | Admitting: Nurse Practitioner

## 2022-11-20 ENCOUNTER — Encounter: Payer: Self-pay | Admitting: Nurse Practitioner

## 2022-11-22 ENCOUNTER — Telehealth: Payer: Self-pay

## 2022-11-22 NOTE — Telephone Encounter (Addendum)
Prior auth approved through 11/22/23    Ozempic prior auth completed and submitted.

## 2022-11-25 ENCOUNTER — Other Ambulatory Visit: Payer: Self-pay | Admitting: Nurse Practitioner

## 2022-11-25 DIAGNOSIS — F419 Anxiety disorder, unspecified: Secondary | ICD-10-CM

## 2022-12-13 ENCOUNTER — Ambulatory Visit: Payer: BC Managed Care – PPO | Admitting: Nurse Practitioner

## 2022-12-17 NOTE — Progress Notes (Addendum)
Assessment and Plan:  Kaitlin Garcia was seen today for follow-up.  Diagnoses and all orders for this visit:  Essential hypertension - continue medications- Losartan 25 mg every day and hydrochlorothiazide 25 mg every day.  Continue DASH diet, exercise and monitor at home. Call if greater than 130/80.    Morbid obesity (HCC) Fair life protein shakes Eat more frequently - try not to go more than 6 hours without protein Aim for 90 grams of protein a day- 30 breakfast/30 lunch 30 dinner Try to keep net carbs less than 50 Net Carbs=Total Carbs-fiber- sugar alcohols Exercise heartrate 120-140(fat burning zone)- walking 20-30 minutes 4 days a week    Type 2 diabetes mellitus with morbid obesity (HCC) Continue Ozempic 0.5mg , pt will consider increasing to next dose- denies side effects Continue diet low in simple carbs and continue to increase activity/exercise      Further disposition pending results of labs. Discussed med's effects and SE's.   Over 30 minutes of exam, counseling, chart review, and critical decision making was performed.   Future Appointments  Date Time Provider Department Center  04/17/2023  3:00 PM Raynelle Dick, NP [facility] None    ------------------------------------------------------------------------------------------------------------------   HPI BP 122/82   Pulse 75   Temp (!) 97.3 F (36.3 C)   Ht 5\' 4"  (1.626 m)   Wt 231 lb (104.8 kg)   SpO2 95%   BMI 39.65 kg/m  62 y.o.female presents for reevaluation of blood pressure  At last visit 11/14/22 BP was elevated and was started on Losartan 25 mg every day and was to continue hydrochlorothiazide 25 mg every day She has been checking her BP at home and has brought her log, readings in 115-130/60-70's and BP cuff for calibration- her cuff read 122/75 BP Readings from Last 3 Encounters:  12/18/22 122/82  11/14/22 (!) 144/86  04/16/22 (!) 152/92    BMI is Body mass index is 39.65 kg/m., she has been  working on diet and exercise. She was started on Ozempic 0.25 mg SQ QW at last visit for Type 2 DM. She is currently on 0.5 mg dose and would like to stay on this dose. She is eating less simple carbs and has been doing more walking Wt Readings from Last 3 Encounters:  12/18/22 231 lb (104.8 kg)  11/14/22 235 lb 9.6 oz (106.9 kg)  04/16/22 236 lb (107 kg)   Lab Results  Component Value Date   HGBA1C 7.0 (H) 11/14/2022      Past Medical History:  Diagnosis Date   Allergy    Anxiety    History of nephrolithiasis    Hyperlipidemia    Hypertension    Prediabetes      Allergies  Allergen Reactions   Codeine Nausea Only   Penicillins     Current Outpatient Medications on File Prior to Visit  Medication Sig   aspirin 81 MG EC tablet Take 81 mg by mouth daily. Swallow whole.   cetirizine (ZYRTEC) 10 MG tablet Take 10 mg by mouth daily.   CITRUS BERGAMOT PO Take 1,000 mg by mouth.   escitalopram (LEXAPRO) 20 MG tablet TAKE 1 TABLET BY MOUTH DAILY FOR MOOD   hydrochlorothiazide (HYDRODIURIL) 25 MG tablet TAKE 1 TABLET BY MOUTH DAILY   losartan (COZAAR) 25 MG tablet Take 1 tablet (25 mg total) by mouth daily.   Multiple Vitamin (MULTIVITAMIN ADULT PO) Take by mouth daily. With extra D3   Probiotic Product (PROBIOTIC DAILY PO) Take by mouth.   Semaglutide,0.25  or 0.5MG /DOS, 2 MG/3ML SOPN Inject 0.25 mg into the skin once a week.   LYSINE PO Take by mouth. Only when she has a fever blister (Patient not taking: Reported on 12/18/2022)   No current facility-administered medications on file prior to visit.    ROS: all negative except above.   Physical Exam:  BP 122/82   Pulse 75   Temp (!) 97.3 F (36.3 C)   Ht 5\' 4"  (1.626 m)   Wt 231 lb (104.8 kg)   SpO2 95%   BMI 39.65 kg/m   General Appearance: very pleasant obese female, in no apparent distress. Eyes: PERRLA, EOMs, conjunctiva no swelling or erythema Neck: Supple, thyroid normal.  Respiratory: Respiratory effort  normal, BS equal bilaterally without rales, rhonchi, wheezing or stridor.  Cardio: RRR with no MRGs. Brisk peripheral pulses without edema.  Abdomen: Soft, + BS.  Musculoskeletal: Full ROM, 5/5 strength, normal gait.  Skin: Warm, dry without rashes, lesions, ecchymosis.  Neuro: Cranial nerves intact. Normal muscle tone, no cerebellar symptoms. Sensation intact.  Psych: Awake and oriented X 3, normal affect, Insight and Judgment appropriate.     Raynelle Dick, NP 11:35 AM [name]

## 2022-12-18 ENCOUNTER — Ambulatory Visit (INDEPENDENT_AMBULATORY_CARE_PROVIDER_SITE_OTHER): Payer: BC Managed Care – PPO | Admitting: Nurse Practitioner

## 2022-12-18 ENCOUNTER — Encounter: Payer: Self-pay | Admitting: Nurse Practitioner

## 2022-12-18 VITALS — BP 122/82 | HR 75 | Temp 97.3°F | Ht 64.0 in | Wt 231.0 lb

## 2022-12-18 DIAGNOSIS — I10 Essential (primary) hypertension: Secondary | ICD-10-CM | POA: Diagnosis not present

## 2022-12-18 DIAGNOSIS — E1169 Type 2 diabetes mellitus with other specified complication: Secondary | ICD-10-CM | POA: Diagnosis not present

## 2022-12-18 NOTE — Patient Instructions (Signed)
Fair life protein shakes Eat more frequently - try not to go more than 6 hours without protein Aim for 90 grams of protein a day- 30 breakfast/30 lunch 30 dinner Try to keep net carbs less than 50 Net Carbs=Total Carbs-fiber- sugar alcohols Exercise heartrate 120-140(fat burning zone)- walking 20-30 minutes 4 days a week

## 2022-12-23 ENCOUNTER — Encounter: Payer: Self-pay | Admitting: Nurse Practitioner

## 2022-12-24 ENCOUNTER — Other Ambulatory Visit: Payer: Self-pay | Admitting: Nurse Practitioner

## 2022-12-24 DIAGNOSIS — Z79899 Other long term (current) drug therapy: Secondary | ICD-10-CM

## 2022-12-24 DIAGNOSIS — E785 Hyperlipidemia, unspecified: Secondary | ICD-10-CM

## 2022-12-24 MED ORDER — SEMAGLUTIDE(0.25 OR 0.5MG/DOS) 2 MG/3ML ~~LOC~~ SOPN
0.5000 mg | PEN_INJECTOR | SUBCUTANEOUS | 2 refills | Status: DC
Start: 2022-12-24 — End: 2023-02-05

## 2023-01-01 ENCOUNTER — Other Ambulatory Visit: Payer: Self-pay

## 2023-01-01 DIAGNOSIS — I10 Essential (primary) hypertension: Secondary | ICD-10-CM

## 2023-01-01 MED ORDER — HYDROCHLOROTHIAZIDE 25 MG PO TABS
25.0000 mg | ORAL_TABLET | Freq: Every day | ORAL | 1 refills | Status: DC
Start: 2023-01-01 — End: 2023-09-11

## 2023-02-05 ENCOUNTER — Other Ambulatory Visit: Payer: Self-pay | Admitting: Nurse Practitioner

## 2023-02-05 ENCOUNTER — Encounter: Payer: Self-pay | Admitting: Nurse Practitioner

## 2023-02-05 DIAGNOSIS — Z79899 Other long term (current) drug therapy: Secondary | ICD-10-CM

## 2023-02-05 DIAGNOSIS — E1169 Type 2 diabetes mellitus with other specified complication: Secondary | ICD-10-CM

## 2023-02-05 MED ORDER — SEMAGLUTIDE (1 MG/DOSE) 4 MG/3ML ~~LOC~~ SOPN: 1.0000 mg | PEN_INJECTOR | SUBCUTANEOUS | 3 refills | Status: DC

## 2023-02-14 NOTE — Progress Notes (Signed)
3 MONTH Follow UP  Assessment and Plan:   Essential hypertension -Continue Losartan 25 mg 1 tab daily and  hydrochlorothiazide 25 mg daily for blood pressure control - Continue DASH diet, exercise and monitor at home. Call if greater than 130/80.  Go to the ER if any chest pain, shortness of breath, nausea, dizziness, severe HA, changes vision/speech  -     CBC with Differential/Platelet -     COMPLETE METABOLIC PANEL WITH GFR   Hyperlipidemia associated with type 2 diabetes mellitus (HCC) -     Lipid panel - declines statins and Zetia at this time - not at goal, strongly encourage diet, exercise and weight loss  Diabetes mellitus with hyperlipidemia (HCC) -     Hemoglobin A1c       - Continue diet and exercise, weight loss declines statins and zetia  Continue Ozempic 1 mg SQ QW  Anxiety Continue Lexapro 20 mg every day  and behavior modifications- diet and exercise, good sleep hygiene  Type 2 Diabetes Mellitus with Morbid Obesity (HCC) Fair life protein shakes Eat more frequently - try not to go more than 6 hours without protein Aim for 90 grams of protein a day- 30 breakfast/30 lunch 30 dinner Try to keep net carbs less than 50 Net Carbs=Total Carbs-fiber- sugar alcohols Exercise heartrate 120-140(fat burning zone)- walking 20-30 minutes 4 days a week  Continue Ozempic 1 mg SQ QW - TSH  Vitamin D deficiency Continue supplement  Medication management -     Magnesium - TSH  Leg cramps Start Magnesium citrate 400 mg every day Increase water intake  Flu vaccine need - Fluzone trivalent flu vaccine given  Continue diet and meds as discussed. Further disposition pending results of labs. Discussed med's effects and SE's.  Future Appointments  Date Time Provider Department Center  04/17/2023  3:00 PM Raynelle Dick, NP [facility] None     HPI 62 y.o. female  presents for CPE and 3 month follow up with hypertension, hyperlipidemia, diabetes and vitamin D.  Her  blood pressure has been controlled at home, BP's running 110-130/70's, today their BP is BP: 134/78, She is currently on HCTZ 25 mg every day and losartan 25 mg every day .  BP Readings from Last 3 Encounters:  02/18/23 134/78  12/18/22 122/82  11/14/22 (!) 144/86  She does workout. She denies chest pain, shortness of breath, dizziness.   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  BMI is Body mass index is 38.79 kg/m., she is working on diet and exercise. She is doing cubi everyday and 5-10 minutes on elliptical for exercise.  Limiting saturated fats and simple carbs. She is down 9 pounds. When she increased from 0.5 mg to 1 mg noticed more leg cramps Wt Readings from Last 3 Encounters:  02/18/23 226 lb (102.5 kg)  12/18/22 231 lb (104.8 kg)  11/14/22 235 lb 9.6 oz (106.9 kg)   She is not on cholesterol medication  She eats very little dairy, eggs, red meat. She does not eat fried foods. Her cholesterol is not at goal. Lab Results  Component Value Date   CHOL 255 (H) 11/14/2022   HDL 46 (L) 11/14/2022   LDLCALC 164 (H) 11/14/2022   TRIG 295 (H) 11/14/2022   CHOLHDL 5.5 (H) 11/14/2022   She has been working on diet and exercise for Diabetes  With hyperlipidemia- refuses statins- never started zetia Could not tolerate jardiance due to yeast Had severe  diarrhea with Metformin Currently on Ozempic 1 mg SQ QW- did get some constipation, using benefiber gummies which is helping.  she is on bASA does not check sugars at Home denies paresthesia of the feet, polydipsia, polyuria and visual disturbances  Lab Results  Component Value Date   HGBA1C 7.0 (H) 11/14/2022   She is drinking lots of water Lab Results  Component Value Date   EGFR 103 11/14/2022   Patient is on Vitamin D supplement. Lab Results  Component Value Date   VD25OH 37 04/16/2022      She is doing well on the lexapro 20 mg every day , anxiety symptoms are  controlled  She has a history of kidney stones, she has kidney stones in left kidney but currently asymptomatic.    Current Medications:   Current Outpatient Medications (Endocrine & Metabolic):    Semaglutide, 1 MG/DOSE, 4 MG/3ML SOPN, Inject 1 mg into the skin once a week.  Current Outpatient Medications (Cardiovascular):    hydrochlorothiazide (HYDRODIURIL) 25 MG tablet, Take 1 tablet (25 mg total) by mouth daily.   losartan (COZAAR) 25 MG tablet, Take 1 tablet (25 mg total) by mouth daily.  Current Outpatient Medications (Respiratory):    cetirizine (ZYRTEC) 10 MG tablet, Take 10 mg by mouth daily.  Current Outpatient Medications (Analgesics):    aspirin 81 MG EC tablet, Take 81 mg by mouth daily. Swallow whole.   Current Outpatient Medications (Other):    CITRUS BERGAMOT PO, Take 1,000 mg by mouth.   escitalopram (LEXAPRO) 20 MG tablet, TAKE 1 TABLET BY MOUTH DAILY FOR MOOD   METAMUCIL FIBER PO, Take by mouth.   Multiple Vitamin (MULTIVITAMIN ADULT PO), Take by mouth daily. With extra D3   Probiotic Product (PROBIOTIC DAILY PO), Take by mouth.   LYSINE PO, Take by mouth. Only when she has a fever blister (Patient not taking: Reported on 12/18/2022)  Medical History:  Past Medical History:  Diagnosis Date   Allergy    Anxiety    History of nephrolithiasis    Hyperlipidemia    Hypertension    Prediabetes      Allergies Allergies  Allergen Reactions   Codeine Nausea Only   Penicillins     SURGICAL HISTORY She  has a past surgical history that includes Tonsillectomy and adenoidectomy and Cesarean section (1995). FAMILY HISTORY Her family history includes Cancer in her father; Hypertension in her father and mother. SOCIAL HISTORY She  reports that she has quit smoking. She has never used smokeless tobacco. She reports that she does not drink alcohol and does not use drugs.  Review of Systems  Constitutional: Negative.  Negative for chills and fever.  HENT:  Negative.  Negative for congestion, hearing loss, sinus pain, sore throat and tinnitus.   Eyes: Negative.  Negative for blurred vision and double vision.  Respiratory: Negative.  Negative for cough, hemoptysis, sputum production, shortness of breath and wheezing.   Cardiovascular: Negative.  Negative for chest pain, palpitations and leg swelling.  Gastrointestinal: Negative.  Negative for abdominal pain, constipation, diarrhea, heartburn, nausea and vomiting.  Genitourinary: Negative.  Negative for dysuria and urgency.  Musculoskeletal: Negative.  Negative for back pain, falls, joint pain, myalgias and neck pain.       Leg cramps  Skin: Negative.  Negative for rash.  Neurological: Negative.  Negative for dizziness, tingling, tremors, weakness and headaches.  Endo/Heme/Allergies: Negative.  Does not bruise/bleed easily.  Psychiatric/Behavioral: Negative.  Negative for depression and suicidal ideas. The patient is  not nervous/anxious and does not have insomnia.     Physical Exam: BP 134/78   Pulse 72   Temp (!) 97.3 F (36.3 C)   Ht 5\' 4"  (1.626 m)   Wt 226 lb (102.5 kg)   SpO2 95%   BMI 38.79 kg/m  Wt Readings from Last 3 Encounters:  02/18/23 226 lb (102.5 kg)  12/18/22 231 lb (104.8 kg)  11/14/22 235 lb 9.6 oz (106.9 kg)   General Appearance: very pleasant obese female in no apparent distress Eyes: PERRLA, EOMs, conjunctiva no swelling or erythema ENT/Mouth: Ext aud canals clear, TMs without erythema, bulging. No erythema, swelling, or exudate on post pharynx.  Hearing normal.  Neck: Supple, thyroid normal.  Respiratory: Respiratory effort normal, BS equal bilaterally without rales, rhonchi, wheezing or stridor.  Cardio: RRR with no MRGs. Brisk peripheral pulses without edema.  Abdomen: Soft, + BS.  Non tender, no guarding, rebound, hernias, masses. Lymphatics: Non tender without lymphadenopathy.  Musculoskeletal: Full ROM, 5/5 strength, normal gait.  Skin: Warm, dry without  rashes, lesions, ecchymosis.  Neuro: Cranial nerves intact. No cerebellar symptoms. Sensation intact.  Psych: Awake and oriented X 3, normal affect, Insight and Judgment appropriate.     Raynelle Dick, NP 11:56 AM [name]

## 2023-02-18 ENCOUNTER — Ambulatory Visit (INDEPENDENT_AMBULATORY_CARE_PROVIDER_SITE_OTHER): Payer: BC Managed Care – PPO | Admitting: Nurse Practitioner

## 2023-02-18 ENCOUNTER — Encounter: Payer: Self-pay | Admitting: Nurse Practitioner

## 2023-02-18 VITALS — BP 134/78 | HR 72 | Temp 97.3°F | Ht 64.0 in | Wt 226.0 lb

## 2023-02-18 DIAGNOSIS — E1169 Type 2 diabetes mellitus with other specified complication: Secondary | ICD-10-CM | POA: Diagnosis not present

## 2023-02-18 DIAGNOSIS — I10 Essential (primary) hypertension: Secondary | ICD-10-CM | POA: Diagnosis not present

## 2023-02-18 DIAGNOSIS — E559 Vitamin D deficiency, unspecified: Secondary | ICD-10-CM | POA: Diagnosis not present

## 2023-02-18 DIAGNOSIS — R252 Cramp and spasm: Secondary | ICD-10-CM

## 2023-02-18 DIAGNOSIS — F419 Anxiety disorder, unspecified: Secondary | ICD-10-CM

## 2023-02-18 DIAGNOSIS — Z79899 Other long term (current) drug therapy: Secondary | ICD-10-CM

## 2023-02-18 DIAGNOSIS — Z23 Encounter for immunization: Secondary | ICD-10-CM

## 2023-02-18 DIAGNOSIS — E785 Hyperlipidemia, unspecified: Secondary | ICD-10-CM | POA: Diagnosis not present

## 2023-02-18 NOTE — Patient Instructions (Signed)

## 2023-02-19 LAB — CBC WITH DIFFERENTIAL/PLATELET
Absolute Lymphocytes: 3511 {cells}/uL (ref 850–3900)
Absolute Monocytes: 479 {cells}/uL (ref 200–950)
Basophils Absolute: 50 {cells}/uL (ref 0–200)
Basophils Relative: 0.6 %
Eosinophils Absolute: 67 {cells}/uL (ref 15–500)
Eosinophils Relative: 0.8 %
HCT: 46.2 % — ABNORMAL HIGH (ref 35.0–45.0)
Hemoglobin: 15.4 g/dL (ref 11.7–15.5)
MCH: 29.9 pg (ref 27.0–33.0)
MCHC: 33.3 g/dL (ref 32.0–36.0)
MCV: 89.7 fL (ref 80.0–100.0)
MPV: 9.5 fL (ref 7.5–12.5)
Monocytes Relative: 5.7 %
Neutro Abs: 4292 {cells}/uL (ref 1500–7800)
Neutrophils Relative %: 51.1 %
Platelets: 348 10*3/uL (ref 140–400)
RBC: 5.15 10*6/uL — ABNORMAL HIGH (ref 3.80–5.10)
RDW: 11.9 % (ref 11.0–15.0)
Total Lymphocyte: 41.8 %
WBC: 8.4 10*3/uL (ref 3.8–10.8)

## 2023-02-19 LAB — MAGNESIUM: Magnesium: 1.9 mg/dL (ref 1.5–2.5)

## 2023-02-19 LAB — COMPLETE METABOLIC PANEL WITH GFR
AG Ratio: 1.7 (calc) (ref 1.0–2.5)
ALT: 20 U/L (ref 6–29)
AST: 20 U/L (ref 10–35)
Albumin: 4.5 g/dL (ref 3.6–5.1)
Alkaline phosphatase (APISO): 72 U/L (ref 37–153)
BUN: 10 mg/dL (ref 7–25)
CO2: 29 mmol/L (ref 20–32)
Calcium: 9.7 mg/dL (ref 8.6–10.4)
Chloride: 99 mmol/L (ref 98–110)
Creat: 0.64 mg/dL (ref 0.50–1.05)
Globulin: 2.6 g/dL (ref 1.9–3.7)
Glucose, Bld: 120 mg/dL — ABNORMAL HIGH (ref 65–99)
Potassium: 3.6 mmol/L (ref 3.5–5.3)
Sodium: 139 mmol/L (ref 135–146)
Total Bilirubin: 0.4 mg/dL (ref 0.2–1.2)
Total Protein: 7.1 g/dL (ref 6.1–8.1)
eGFR: 101 mL/min/{1.73_m2} (ref >=60)

## 2023-02-19 LAB — LIPID PANEL
Cholesterol: 259 mg/dL — ABNORMAL HIGH (ref <200)
HDL: 50 mg/dL (ref >=50)
LDL Cholesterol (Calc): 171 mg/dL — ABNORMAL HIGH
Non-HDL Cholesterol (Calc): 209 mg/dL — ABNORMAL HIGH (ref <130)
Total CHOL/HDL Ratio: 5.2 (calc) — ABNORMAL HIGH (ref <5.0)
Triglycerides: 213 mg/dL — ABNORMAL HIGH (ref <150)

## 2023-02-19 LAB — HEMOGLOBIN A1C W/OUT EAG: Hgb A1c MFr Bld: 6.1 %{Hb} — ABNORMAL HIGH (ref <5.7)

## 2023-02-19 LAB — TSH: TSH: 2.19 m[IU]/L (ref 0.40–4.50)

## 2023-04-17 ENCOUNTER — Encounter: Payer: BC Managed Care – PPO | Admitting: Nurse Practitioner

## 2023-04-26 DIAGNOSIS — Z1231 Encounter for screening mammogram for malignant neoplasm of breast: Secondary | ICD-10-CM | POA: Diagnosis not present

## 2023-04-26 LAB — HM MAMMOGRAPHY

## 2023-05-21 ENCOUNTER — Encounter: Payer: BC Managed Care – PPO | Admitting: Nurse Practitioner

## 2023-05-30 ENCOUNTER — Other Ambulatory Visit: Payer: Self-pay

## 2023-05-30 DIAGNOSIS — E785 Hyperlipidemia, unspecified: Secondary | ICD-10-CM

## 2023-05-30 MED ORDER — SEMAGLUTIDE (1 MG/DOSE) 4 MG/3ML ~~LOC~~ SOPN: 1.0000 mg | PEN_INJECTOR | SUBCUTANEOUS | 3 refills | Status: DC

## 2023-06-10 ENCOUNTER — Other Ambulatory Visit: Payer: Self-pay

## 2023-06-10 DIAGNOSIS — F419 Anxiety disorder, unspecified: Secondary | ICD-10-CM

## 2023-06-10 MED ORDER — ESCITALOPRAM OXALATE 20 MG PO TABS: ORAL_TABLET | ORAL | 0 refills | Status: DC

## 2023-08-28 ENCOUNTER — Ambulatory Visit: Admitting: Nurse Practitioner

## 2023-09-07 ENCOUNTER — Other Ambulatory Visit: Payer: Self-pay | Admitting: Family

## 2023-09-07 DIAGNOSIS — F419 Anxiety disorder, unspecified: Secondary | ICD-10-CM

## 2023-09-09 NOTE — Progress Notes (Unsigned)
 Subjective:     Patient ID: Kaitlin Garcia, female    DOB: 12/09/1960, 63 y.o.   MRN: [id number]  No chief complaint on file.   HPI    History of Present Illness              Health Maintenance Due  Topic Date Due   OPHTHALMOLOGY EXAM  Never done   Zoster Vaccines- Shingrix (1 of 2) Never done   Colonoscopy  Never done   Pneumococcal Vaccine 58-48 Years old (2 of 2 - PCV) 06/10/2014   FOOT EXAM  03/28/2021   COVID-19 Vaccine (3 - 2024-25 season) 12/02/2022   Diabetic kidney evaluation - Urine ACR  04/17/2023   HEMOGLOBIN A1C  08/18/2023    Past Medical History:  Diagnosis Date   Allergy    Anxiety    History of nephrolithiasis    Hyperlipidemia    Hypertension    Prediabetes     Past Surgical History:  Procedure Laterality Date   CESAREAN SECTION  1995   TONSILLECTOMY AND ADENOIDECTOMY      Family History  Problem Relation Age of Onset   Hypertension Mother    Cancer Father        bladder   Hypertension Father     Social History   Socioeconomic History   Marital status: Married    Spouse name: Not on file   Number of children: Not on file   Years of education: Not on file   Highest education level: Some college, no degree  Occupational History   Not on file  Tobacco Use   Smoking status: Former   Smokeless tobacco: Never  Substance and Sexual Activity   Alcohol use: No   Drug use: No   Sexual activity: Not on file  Other Topics Concern   Not on file  Social History Narrative   Not on file   Social Drivers of Health   Financial Resource Strain: Low Risk  (08/24/2023)   Overall Financial Resource Strain (CARDIA)    Difficulty of Paying Living Expenses: Not hard at all  Food Insecurity: No Food Insecurity (08/24/2023)   Hunger Vital Sign    Worried About Running Out of Food in the Last Year: Never true    Ran Out of Food in the Last Year: Never true  Transportation Needs: No Transportation Needs (08/24/2023)   PRAPARE -  Administrator, Civil Service (Medical): No    Lack of Transportation (Non-Medical): No  Physical Activity: Insufficiently Active (08/24/2023)   Exercise Vital Sign    Days of Exercise per Week: 1 day    Minutes of Exercise per Session: 60 min  Stress: Stress Concern Present (08/24/2023)   [employer] of Occupational Health - Occupational Stress Questionnaire    Feeling of Stress : To some extent  Social Connections: Moderately Integrated (08/24/2023)   Social Connection and Isolation Panel [NHANES]    Frequency of Communication with Friends and Family: More than three times a week    Frequency of Social Gatherings with Friends and Family: Three times a week    Attends Religious Services: More than 4 times per year    Active Member of Clubs or Organizations: No    Attends Engineer, structural: Not on file    Marital Status: Married  Catering manager Violence: Not on file    Outpatient Medications Prior to Visit  Medication Sig Dispense Refill   aspirin  81 MG EC tablet Take 81 mg  by mouth daily. Swallow whole.     cetirizine (ZYRTEC) 10 MG tablet Take 10 mg by mouth daily.     CITRUS BERGAMOT PO Take 1,000 mg by mouth.     escitalopram  (LEXAPRO ) 20 MG tablet TAKE 1 TABLET BY MOUTH DAILY FOR MOOD 90 tablet 0   hydrochlorothiazide  (HYDRODIURIL ) 25 MG tablet Take 1 tablet (25 mg total) by mouth daily. 90 tablet 1   losartan  (COZAAR ) 25 MG tablet Take 1 tablet (25 mg total) by mouth daily. 30 tablet 11   LYSINE PO Take by mouth. Only when she has a fever blister (Patient not taking: Reported on 12/18/2022)     METAMUCIL FIBER PO Take by mouth.     Multiple Vitamin (MULTIVITAMIN ADULT PO) Take by mouth daily. With extra D3     Probiotic Product (PROBIOTIC DAILY PO) Take by mouth.     Semaglutide , 1 MG/DOSE, 4 MG/3ML SOPN Inject 1 mg into the skin once a week. 3 mL 3   No facility-administered medications prior to visit.    Allergies  Allergen Reactions    Codeine Nausea Only   Penicillins     ROS     Objective:     Physical Exam   There were no vitals taken for this visit. Wt Readings from Last 3 Encounters:  02/18/23 226 lb (102.5 kg)  12/18/22 231 lb (104.8 kg)  11/14/22 235 lb 9.6 oz (106.9 kg)       Assessment & Plan:   Problem List Items Addressed This Visit   None   I am having Geraldin C. Stamant maintain her cetirizine, aspirin  EC, Probiotic Product (PROBIOTIC DAILY PO), Multiple Vitamin (MULTIVITAMIN ADULT PO), LYSINE PO, CITRUS BERGAMOT PO, losartan , hydrochlorothiazide , METAMUCIL FIBER PO, Semaglutide  (1 MG/DOSE), and escitalopram .  No orders of the defined types were placed in this encounter.

## 2023-09-10 DIAGNOSIS — Z7689 Persons encountering health services in other specified circumstances: Secondary | ICD-10-CM | POA: Insufficient documentation

## 2023-09-10 NOTE — Patient Instructions (Signed)

## 2023-09-11 ENCOUNTER — Encounter: Payer: Self-pay | Admitting: Student

## 2023-09-11 ENCOUNTER — Ambulatory Visit (INDEPENDENT_AMBULATORY_CARE_PROVIDER_SITE_OTHER): Admitting: Student

## 2023-09-11 VITALS — BP 130/86 | HR 61 | Temp 98.2°F | Resp 12 | Ht 64.0 in | Wt 225.2 lb

## 2023-09-11 DIAGNOSIS — F419 Anxiety disorder, unspecified: Secondary | ICD-10-CM | POA: Diagnosis not present

## 2023-09-11 DIAGNOSIS — Z7985 Long-term (current) use of injectable non-insulin antidiabetic drugs: Secondary | ICD-10-CM

## 2023-09-11 DIAGNOSIS — E559 Vitamin D deficiency, unspecified: Secondary | ICD-10-CM

## 2023-09-11 DIAGNOSIS — I10 Essential (primary) hypertension: Secondary | ICD-10-CM

## 2023-09-11 DIAGNOSIS — Z7689 Persons encountering health services in other specified circumstances: Secondary | ICD-10-CM

## 2023-09-11 DIAGNOSIS — E785 Hyperlipidemia, unspecified: Secondary | ICD-10-CM | POA: Diagnosis not present

## 2023-09-11 DIAGNOSIS — E1169 Type 2 diabetes mellitus with other specified complication: Secondary | ICD-10-CM

## 2023-09-11 MED ORDER — ESCITALOPRAM OXALATE 20 MG PO TABS: ORAL_TABLET | ORAL | 0 refills | Status: DC

## 2023-09-11 MED ORDER — OZEMPIC (0.25 OR 0.5 MG/DOSE) 2 MG/3ML ~~LOC~~ SOPN: 0.2500 mg | PEN_INJECTOR | SUBCUTANEOUS | 1 refills | Status: DC

## 2023-09-11 NOTE — Assessment & Plan Note (Signed)
 Stable on 20 mg Lexapro  daily.

## 2023-09-11 NOTE — Assessment & Plan Note (Addendum)
 BP Stable.  She is taking losartan  25 mg daily.  She has not been taking the hydrochlorothiazide .  Patient desires to lose weight to improve BP. Well controlled, no changes to meds. Encouraged heart healthy diet such as the DASH diet and exercise as tolerated.

## 2023-09-11 NOTE — Assessment & Plan Note (Addendum)
 Check Lipid panel and A1C today. Patient previously on 1 mg of semaglutide  (Ozempic ) 1 mg.  She has not had this medication since February.  We will restart her on semaglutide  0.25 mg.

## 2023-09-11 NOTE — Assessment & Plan Note (Addendum)
 Check lipid panel today.  Patient states she was unable to tolerate oral statins in the past due to muscle fatigue/flulike symptoms.  Encourage heart healthy diet such as MIND or DASH diet, increase exercise, avoid trans fats, simple carbohydrates and processed foods, consider a krill or fish or flaxseed oil cap daily.  If lipid panel continues to be elevated consider cardiology referral for lipid management

## 2023-09-11 NOTE — Assessment & Plan Note (Signed)
Check vitamin D today. 

## 2023-09-12 ENCOUNTER — Ambulatory Visit: Payer: Self-pay | Admitting: Student

## 2023-09-12 LAB — CBC WITH DIFFERENTIAL/PLATELET
Absolute Lymphocytes: 3130 {cells}/uL (ref 850–3900)
Absolute Monocytes: 488 {cells}/uL (ref 200–950)
Basophils Absolute: 59 {cells}/uL (ref 0–200)
Basophils Relative: 0.8 %
Eosinophils Absolute: 96 {cells}/uL (ref 15–500)
Eosinophils Relative: 1.3 %
HCT: 47.7 % — ABNORMAL HIGH (ref 35.0–45.0)
Hemoglobin: 15.3 g/dL (ref 11.7–15.5)
MCH: 29.8 pg (ref 27.0–33.0)
MCHC: 32.1 g/dL (ref 32.0–36.0)
MCV: 92.8 fL (ref 80.0–100.0)
MPV: 9.6 fL (ref 7.5–12.5)
Monocytes Relative: 6.6 %
Neutro Abs: 3626 {cells}/uL (ref 1500–7800)
Neutrophils Relative %: 49 %
Platelets: 298 10*3/uL (ref 140–400)
RBC: 5.14 10*6/uL — ABNORMAL HIGH (ref 3.80–5.10)
RDW: 12.3 % (ref 11.0–15.0)
Total Lymphocyte: 42.3 %
WBC: 7.4 10*3/uL (ref 3.8–10.8)

## 2023-09-12 LAB — HEMOGLOBIN A1C
Hgb A1c MFr Bld: 5.7 % — ABNORMAL HIGH (ref <5.7)
Mean Plasma Glucose: 117 mg/dL
eAG (mmol/L): 6.5 mmol/L

## 2023-09-12 LAB — VITAMIN D 25 HYDROXY (VIT D DEFICIENCY, FRACTURES): Vit D, 25-Hydroxy: 72 ng/mL (ref 30–100)

## 2023-10-22 ENCOUNTER — Other Ambulatory Visit: Payer: Self-pay | Admitting: Nurse Practitioner

## 2023-10-22 DIAGNOSIS — I10 Essential (primary) hypertension: Secondary | ICD-10-CM

## 2023-10-22 DIAGNOSIS — Z79899 Other long term (current) drug therapy: Secondary | ICD-10-CM

## 2023-11-01 ENCOUNTER — Ambulatory Visit: Admitting: Student

## 2023-11-25 ENCOUNTER — Other Ambulatory Visit: Payer: Self-pay | Admitting: Student

## 2023-11-25 DIAGNOSIS — F419 Anxiety disorder, unspecified: Secondary | ICD-10-CM

## 2023-11-25 NOTE — Telephone Encounter (Signed)
Left message on machine to call back to schedule follow up appointment. ?

## 2023-12-09 DIAGNOSIS — Z0000 Encounter for general adult medical examination without abnormal findings: Secondary | ICD-10-CM | POA: Insufficient documentation

## 2023-12-09 DIAGNOSIS — E669 Obesity, unspecified: Secondary | ICD-10-CM | POA: Insufficient documentation

## 2023-12-09 NOTE — Assessment & Plan Note (Signed)
 Patient encouraged to maintain heart healthy diet, regular exercise, adequate sleep. Consider daily probiotics. Take medications as prescribed. Labs ordered and reviewed

## 2023-12-09 NOTE — Assessment & Plan Note (Signed)
 hgba1c acceptable, minimize simple carbs. Increase exercise as tolerated. Continue current meds

## 2023-12-09 NOTE — Progress Notes (Unsigned)
 Subjective:     Patient ID: Kaitlin Garcia, female    DOB: 01/17/1961, 63 y.o.   MRN: [id number]  No chief complaint on file.   HPI  Discussed the use of AI scribe software for clinical note transcription with the patient, who gave verbal consent to proceed.   DM Type 2 Semaglutide  weekyl injection-  *DOSE?     Hypertension--??? MEDS?? Losartan  (Cozaar ) 25 mg daily, hydrochlorothiazide  25 mg daily She is NOT currently taking hydrocholorthiazide, she has been taking Losatran 25 mg daily   Depression/Anxiety Lexapro  20 mg daily Compliant Mood stable   Hyperlipidemia No medications-states she was not able to tolerate statins.  Previously on Repatha.  Vitamin D  deficiency 1000 units Vitamin D3 daily    HLD The 10-year ASCVD risk score (Arnett DK, et al., 2019) is: 8.5%   Values used to calculate the score:     Age: 55 years     Clincally relevant sex: Female     Is Non-Hispanic African American: No     Diabetic: Yes     Tobacco smoker: No     Systolic Blood Pressure: 130 mmHg     Is BP treated: No     HDL Cholesterol: 56 mg/dL     Total Cholesterol: 260 mg/dL    History of Present Illness              Health Maintenance Due  Topic Date Due   Zoster Vaccines- Shingrix  (1 of 2) Never done   Pneumococcal Vaccine: 50+ Years (2 of 2 - PCV) 06/10/2014   FOOT EXAM  03/28/2021   Diabetic kidney evaluation - Urine ACR  04/17/2023   Influenza Vaccine  11/01/2023   COVID-19 Vaccine (3 - 2025-26 season) 12/02/2023    Past Medical History:  Diagnosis Date   Allergy    Anxiety    Diabetes mellitus without complication (HCC) 12/2015   History of nephrolithiasis    Hyperlipidemia    Hypertension     Past Surgical History:  Procedure Laterality Date   CESAREAN SECTION  1995   TONSILLECTOMY AND ADENOIDECTOMY      Family History  Problem Relation Age of Onset   Hypertension Mother    Cancer Father        bladder   Hypertension Father    Diabetes  Father    Dementia Maternal Grandmother    Heart attack Maternal Grandfather    Heart attack Paternal Grandfather    Breast cancer Maternal Aunt     Social History   Socioeconomic History   Marital status: Married    Spouse name: Not on file   Number of children: Not on file   Years of education: Not on file   Highest education level: Some college, no degree  Occupational History   Not on file  Tobacco Use   Smoking status: Former    Current packs/day: 0.00    Average packs/day: 0.5 packs/day for 8.0 years (4.0 ttl pk-yrs)    Types: Cigarettes    Quit date: 11/01/2003    Years since quitting: 20.1   Smokeless tobacco: Never  Substance and Sexual Activity   Alcohol use: No   Drug use: No   Sexual activity: Yes    Birth control/protection: Post-menopausal    Comment: married  Other Topics Concern   Not on file  Social History Narrative   Not on file   Social Drivers of Health   Financial Resource Strain: Low Risk  (08/24/2023)  Overall Financial Resource Strain (CARDIA)    Difficulty of Paying Living Expenses: Not hard at all  Food Insecurity: No Food Insecurity (08/24/2023)   Hunger Vital Sign    Worried About Running Out of Food in the Last Year: Never true    Ran Out of Food in the Last Year: Never true  Transportation Needs: No Transportation Needs (08/24/2023)   PRAPARE - Administrator, Civil Service (Medical): No    Lack of Transportation (Non-Medical): No  Physical Activity: Insufficiently Active (08/24/2023)   Exercise Vital Sign    Days of Exercise per Week: 1 day    Minutes of Exercise per Session: 60 min  Stress: Stress Concern Present (08/24/2023)   [employer] of Occupational Health - Occupational Stress Questionnaire    Feeling of Stress : To some extent  Social Connections: Moderately Integrated (08/24/2023)   Social Connection and Isolation Panel    Frequency of Communication with Friends and Family: More than three times a week     Frequency of Social Gatherings with Friends and Family: Three times a week    Attends Religious Services: More than 4 times per year    Active Member of Clubs or Organizations: No    Attends Engineer, structural: Not on file    Marital Status: Married  Catering manager Violence: Not on file    Outpatient Medications Prior to Visit  Medication Sig Dispense Refill   aspirin  81 MG EC tablet Take 81 mg by mouth daily. Swallow whole.     cetirizine (ZYRTEC) 10 MG tablet Take 10 mg by mouth daily.     Cholecalciferol (VITAMIN D3) 250 MCG (10000 UT) TABS Take 1 tablet by mouth daily.     escitalopram  (LEXAPRO ) 20 MG tablet TAKE 1 TABLET BY MOUTH DAILY FOR MOOD 90 tablet 0   losartan  (COZAAR ) 25 MG tablet Take 1 tablet (25 mg total) by mouth daily. 30 tablet 11   Multiple Vitamin (MULTIVITAMIN ADULT PO) Take by mouth daily. With extra D3     Probiotic Product (PROBIOTIC DAILY PO) Take by mouth.     Semaglutide ,0.25 or 0.5MG /DOS, (OZEMPIC , 0.25 OR 0.5 MG/DOSE,) 2 MG/3ML SOPN Inject 0.25 mg into the skin once a week. 3 mL 1   No facility-administered medications prior to visit.    Allergies  Allergen Reactions   Codeine Nausea Only   Penicillins     ROS     Objective:    Physical Exam Constitutional:      General: She is not in acute distress.    Appearance: She is not ill-appearing, toxic-appearing or diaphoretic.  HENT:     Head: Normocephalic and atraumatic.     Right Ear: Tympanic membrane, ear canal and external ear normal.     Left Ear: Tympanic membrane, ear canal and external ear normal.     Nose: Nose normal. No congestion.     Mouth/Throat:     Mouth: Mucous membranes are moist.     Pharynx: Oropharynx is clear.  Eyes:     Extraocular Movements: Extraocular movements intact.     Right eye: Normal extraocular motion.     Left eye: Normal extraocular motion.     Conjunctiva/sclera: Conjunctivae normal.     Pupils: Pupils are equal, round, and reactive to  light.  Neck:     Thyroid: No thyroid mass or thyromegaly.     Vascular: No carotid bruit or JVD.  Cardiovascular:     Rate and Rhythm: Normal  rate and regular rhythm.     Pulses: Normal pulses.          Radial pulses are 2+ on the right side and 2+ on the left side.       Dorsalis pedis pulses are 2+ on the right side and 2+ on the left side.     Heart sounds: Normal heart sounds, S1 normal and S2 normal. No murmur heard.    No friction rub. No gallop.  Pulmonary:     Effort: Pulmonary effort is normal. No respiratory distress.     Breath sounds: Normal breath sounds.  Abdominal:     General: Bowel sounds are normal. There is no distension.     Palpations: Abdomen is soft.     Tenderness: There is no abdominal tenderness. There is no guarding.  Musculoskeletal:        General: Normal range of motion.     Cervical back: Full passive range of motion without pain and normal range of motion. No edema or erythema.     Right lower leg: No edema.     Left lower leg: No edema.  Lymphadenopathy:     Cervical: No cervical adenopathy.  Skin:    General: Skin is warm and dry.     Capillary Refill: Capillary refill takes less than 2 seconds.  Neurological:     General: No focal deficit present.     Mental Status: She is alert and oriented to person, place, and time.     Cranial Nerves: No cranial nerve deficit.     Motor: No weakness.     Coordination: Coordination normal.     Gait: Gait normal.     Deep Tendon Reflexes: Reflexes normal.  Psychiatric:        Mood and Affect: Mood normal.        Behavior: Behavior normal.        Thought Content: Thought content normal.         There were no vitals taken for this visit. Wt Readings from Last 3 Encounters:  09/11/23 225 lb 3.2 oz (102.2 kg)  02/18/23 226 lb (102.5 kg)  12/18/22 231 lb (104.8 kg)       Assessment & Plan:   Problem List Items Addressed This Visit     Annual visit for general adult medical examination without  abnormal findings - Primary   Patient encouraged to maintain heart healthy diet, regular exercise, adequate sleep. Consider daily probiotics. Take medications as prescribed Labs ordered and reviewed.        Hyperlipidemia   Encourage heart healthy diet such as MIND or DASH diet, increase exercise, avoid trans fats, simple carbohydrates and processed foods, consider a krill or fish or flaxseed oil cap daily.    Start zetia  10 mg daily. Medication and common side effects reviewed with the patient; patient voiced understanding and had no further questions at this time.       Hypertension   Well controlled, no changes to meds. Encouraged heart healthy diet such as the DASH diet and exercise as tolerated.        Type 2 diabetes mellitus with obesity (HCC)   hgba1c acceptable, minimize simple carbs. Increase exercise as tolerated. Continue current meds.      FU 3 months   HCM Pap- Due 03/2026 CRC- Cologuard due 04/2024 Immunizations- Shingles, Influenza, PNA, Covid 19 due    I am having Kaitlin Garcia maintain her cetirizine, aspirin  EC, Probiotic Product (PROBIOTIC DAILY PO), Multiple  Vitamin (MULTIVITAMIN ADULT PO), losartan , Vitamin D3, Ozempic  (0.25 or 0.5 MG/DOSE), and escitalopram .  No orders of the defined types were placed in this encounter.

## 2023-12-09 NOTE — Assessment & Plan Note (Signed)
 Encourage heart healthy diet such as MIND or DASH diet, increase exercise, avoid trans fats, simple carbohydrates and processed foods, consider a krill or fish or flaxseed oil cap daily.   Rx-zetia  10 mg daily . Medication and common side effects reviewed with the patient; patient voiced understanding and had no further questions at this time.

## 2023-12-09 NOTE — Assessment & Plan Note (Signed)
 Well controlled, no changes to meds. Encouraged heart healthy diet such as the DASH diet and exercise as tolerated.

## 2023-12-11 ENCOUNTER — Encounter: Payer: Self-pay | Admitting: Student

## 2023-12-11 ENCOUNTER — Ambulatory Visit: Admitting: Student

## 2023-12-11 VITALS — BP 171/88 | HR 66 | Temp 97.7°F | Resp 12 | Ht 65.0 in | Wt 222.2 lb

## 2023-12-11 DIAGNOSIS — Z0000 Encounter for general adult medical examination without abnormal findings: Secondary | ICD-10-CM | POA: Diagnosis not present

## 2023-12-11 DIAGNOSIS — F419 Anxiety disorder, unspecified: Secondary | ICD-10-CM

## 2023-12-11 DIAGNOSIS — E1169 Type 2 diabetes mellitus with other specified complication: Secondary | ICD-10-CM

## 2023-12-11 DIAGNOSIS — Z23 Encounter for immunization: Secondary | ICD-10-CM | POA: Diagnosis not present

## 2023-12-11 DIAGNOSIS — Z7985 Long-term (current) use of injectable non-insulin antidiabetic drugs: Secondary | ICD-10-CM

## 2023-12-11 DIAGNOSIS — E785 Hyperlipidemia, unspecified: Secondary | ICD-10-CM | POA: Diagnosis not present

## 2023-12-11 DIAGNOSIS — Z79899 Other long term (current) drug therapy: Secondary | ICD-10-CM

## 2023-12-11 DIAGNOSIS — I10 Essential (primary) hypertension: Secondary | ICD-10-CM

## 2023-12-11 DIAGNOSIS — E669 Obesity, unspecified: Secondary | ICD-10-CM

## 2023-12-11 LAB — MICROALBUMIN / CREATININE URINE RATIO
Creatinine,U: 164.8 mg/dL
Microalb Creat Ratio: 84.7 mg/g — ABNORMAL HIGH (ref 0.0–30.0)
Microalb, Ur: 14 mg/dL — ABNORMAL HIGH (ref 0.0–1.9)

## 2023-12-11 LAB — HEMOGLOBIN A1C: Hgb A1c MFr Bld: 5.8 % (ref 4.6–6.5)

## 2023-12-11 MED ORDER — LOSARTAN POTASSIUM 25 MG PO TABS: 25.0000 mg | ORAL_TABLET | Freq: Every day | ORAL | 1 refills | Status: DC

## 2023-12-11 MED ORDER — EZETIMIBE 10 MG PO TABS: 10.0000 mg | ORAL_TABLET | Freq: Every day | ORAL | 1 refills | Status: DC

## 2023-12-11 NOTE — Assessment & Plan Note (Signed)
 Stable on 20 mg Lexapro  daily. Denies SI/HI

## 2023-12-11 NOTE — Progress Notes (Signed)
 Subjective:     Patient ID: Kaitlin Garcia, female    DOB: November 27, 1960, 63 y.o.   MRN: [id number]  Chief Complaint  Patient presents with   Annual Exam    HPI  Discussed the use of AI scribe software for clinical note transcription with the patient, who gave verbal consent to proceed. Kaitlin Garcia is a 63 year old female with hypertension and diabetes who presents for an annual physical exam.She has restarted Ozempic  at 1 mg weekly, previously losing 30 pounds with it, but has not lost weight since restarting. Her exercise routine includes walking 20-30 minutes three to four times a week and push mowing her yard. She eats one meal a day, typically dinner, and sometimes feels weak. Sleeping fairly well, usually >6 hours per night. She takes vitamin D , a probiotic, and fiber supplements regularly. She plans to retire in April and is concerned about maintaining health without insurance coverage from ages 39 to 59.   DM Type 2 Semaglutide  weekyl injection- she is on 2nd week of 1 mg injection weekly Reports no adverse Ses  Wt Readings from Last 3 Encounters:  12/11/23 222 lb 3.2 oz (100.8 kg)  09/11/23 225 lb 3.2 oz (102.2 kg)  02/18/23 226 lb (102.5 kg)    Hypertension- Losartan  (Cozaar ) 25 mg daily Her home blood pressure readings are around >140 SBP, 80-90 DBP and she is not currently taking losartan .    Depression/Anxiety Lexapro  20 mg daily Compliant Mood stable, Denies SI/HI   Hyperlipidemia No medications-states she was not able to tolerate statins.  Previously on Repatha.  Vitamin D  deficiency 1000 units Vitamin D3 daily   HLD The 10-year ASCVD risk score (Arnett DK, et al., 2019) is: 8.5%   Values used to calculate the score:     Age: 21 years     Clincally relevant sex: Female     Is Non-Hispanic African American: No     Diabetic: Yes     Tobacco smoker: No     Systolic Blood Pressure: 130 mmHg     Is BP treated: No     HDL Cholesterol: 56 mg/dL      Total Cholesterol: 260 mg/dL   Patient denies fever, chills, SOB, CP, palpitations, dyspnea, edema, HA, vision changes, N/V/D, abdominal pain, urinary symptoms, rash, weight changes, and recent illness or hospitalizations.   History of Present Illness              Health Maintenance Due  Topic Date Due   Zoster Vaccines- Shingrix  (1 of 2) Never done   Pneumococcal Vaccine: 50+ Years (2 of 2 - PCV) 06/10/2014   Diabetic kidney evaluation - Urine ACR  04/17/2023   Influenza Vaccine  11/01/2023    Past Medical History:  Diagnosis Date   Allergy    Anxiety    Diabetes mellitus without complication (HCC) 12/2015   History of nephrolithiasis    Hyperlipidemia    Hypertension     Past Surgical History:  Procedure Laterality Date   CESAREAN SECTION  1995   TONSILLECTOMY AND ADENOIDECTOMY      Family History  Problem Relation Age of Onset   Hypertension Mother    Cancer Father        bladder   Hypertension Father    Diabetes Father    Dementia Maternal Grandmother    Heart attack Maternal Grandfather    Heart attack Paternal Grandfather    Breast cancer Maternal Aunt     Social History  Socioeconomic History   Marital status: Married    Spouse name: Not on file   Number of children: Not on file   Years of education: Not on file   Highest education level: Associate degree: occupational, Scientist, product/process development, or vocational program  Occupational History   Not on file  Tobacco Use   Smoking status: Former    Current packs/day: 0.00    Average packs/day: 0.5 packs/day for 8.0 years (4.0 ttl pk-yrs)    Types: Cigarettes    Quit date: 11/01/2003    Years since quitting: 20.1   Smokeless tobacco: Never  Substance and Sexual Activity   Alcohol use: No   Drug use: No   Sexual activity: Yes    Birth control/protection: Post-menopausal    Comment: married  Other Topics Concern   Not on file  Social History Narrative   Not on file   Social Drivers of Health   Financial  Resource Strain: Low Risk  (12/10/2023)   Overall Financial Resource Strain (CARDIA)    Difficulty of Paying Living Expenses: Not hard at all  Food Insecurity: No Food Insecurity (12/10/2023)   Hunger Vital Sign    Worried About Running Out of Food in the Last Year: Never true    Ran Out of Food in the Last Year: Never true  Transportation Needs: No Transportation Needs (12/10/2023)   PRAPARE - Administrator, Civil Service (Medical): No    Lack of Transportation (Non-Medical): No  Physical Activity: Insufficiently Active (12/10/2023)   Exercise Vital Sign    Days of Exercise per Week: 3 days    Minutes of Exercise per Session: 20 min  Stress: Stress Concern Present (12/10/2023)   [employer] of Occupational Health - Occupational Stress Questionnaire    Feeling of Stress: To some extent  Social Connections: Moderately Isolated (12/10/2023)   Social Connection and Isolation Panel    Frequency of Communication with Friends and Family: More than three times a week    Frequency of Social Gatherings with Friends and Family: Twice a week    Attends Religious Services: Never    Database administrator or Organizations: No    Attends Engineer, structural: Not on file    Marital Status: Married  Catering manager Violence: Not on file    Outpatient Medications Prior to Visit  Medication Sig Dispense Refill   BIOTIN PO Take 1 tablet by mouth daily.     cetirizine (ZYRTEC) 10 MG tablet Take 10 mg by mouth daily.     Cholecalciferol (VITAMIN D3) 250 MCG (10000 UT) TABS Take 1 tablet by mouth daily.     escitalopram  (LEXAPRO ) 20 MG tablet TAKE 1 TABLET BY MOUTH DAILY FOR MOOD 90 tablet 0   Multiple Vitamin (MULTIVITAMIN ADULT PO) Take by mouth daily. With extra D3     Probiotic Product (PROBIOTIC DAILY PO) Take by mouth.     Semaglutide ,0.25 or 0.5MG /DOS, (OZEMPIC , 0.25 OR 0.5 MG/DOSE,) 2 MG/3ML SOPN Inject 0.25 mg into the skin once a week. 3 mL 1   aspirin  81 MG EC tablet Take  81 mg by mouth daily. Swallow whole.     losartan  (COZAAR ) 25 MG tablet Take 1 tablet (25 mg total) by mouth daily. (Patient not taking: Reported on 12/11/2023) 30 tablet 11   No facility-administered medications prior to visit.    Allergies  Allergen Reactions   Codeine Nausea Only   Penicillins     ROS     Objective:  Physical Exam Constitutional:      General: She is not in acute distress.    Appearance: She is obese. She is not ill-appearing, toxic-appearing or diaphoretic.  HENT:     Head: Normocephalic and atraumatic.     Right Ear: Tympanic membrane, ear canal and external ear normal.     Left Ear: Tympanic membrane, ear canal and external ear normal.     Nose: Nose normal. No congestion.     Mouth/Throat:     Mouth: Mucous membranes are moist.     Pharynx: Oropharynx is clear.  Eyes:     Extraocular Movements: Extraocular movements intact.     Right eye: Normal extraocular motion.     Left eye: Normal extraocular motion.     Conjunctiva/sclera: Conjunctivae normal.     Pupils: Pupils are equal, round, and reactive to light.  Neck:     Thyroid: No thyroid mass or thyromegaly.     Vascular: No carotid bruit or JVD.  Cardiovascular:     Rate and Rhythm: Normal rate and regular rhythm.     Pulses: Normal pulses.          Radial pulses are 2+ on the right side and 2+ on the left side.       Dorsalis pedis pulses are 2+ on the right side and 2+ on the left side.     Heart sounds: Normal heart sounds, S1 normal and S2 normal. No murmur heard.    No friction rub. No gallop.  Pulmonary:     Effort: Pulmonary effort is normal. No respiratory distress.     Breath sounds: Normal breath sounds.  Abdominal:     General: Bowel sounds are normal. There is no distension.     Palpations: Abdomen is soft.     Tenderness: There is no abdominal tenderness. There is no guarding.  Musculoskeletal:        General: Normal range of motion.     Cervical back: Full passive range of  motion without pain and normal range of motion. No edema or erythema.     Right lower leg: No edema.     Left lower leg: No edema.  Lymphadenopathy:     Cervical: No cervical adenopathy.  Skin:    General: Skin is warm and dry.     Capillary Refill: Capillary refill takes less than 2 seconds.  Neurological:     General: No focal deficit present.     Mental Status: She is alert and oriented to person, place, and time.     Cranial Nerves: No cranial nerve deficit.     Motor: No weakness.     Coordination: Coordination normal.     Gait: Gait normal.     Deep Tendon Reflexes: Reflexes normal.  Psychiatric:        Mood and Affect: Mood normal.        Behavior: Behavior normal.        Thought Content: Thought content normal.    Foot Exam: Normal. No deformities, lesions, or ulcers noted. Skin warm and intact. Capillary refill <2 seconds. Dorsalis pedis and posterior tibial pulses +2. Sensation intact to monofilament testing in all tested areas.   BP (!) 171/88 (BP Location: Right Arm, Patient Position: Sitting, Cuff Size: Large)   Pulse 66   Temp 97.7 F (36.5 C) (Oral)   Resp 12   Ht 5' 5 (1.651 m)   Wt 222 lb 3.2 oz (100.8 kg)   SpO2 98%  BMI 36.98 kg/m  Wt Readings from Last 3 Encounters:  12/11/23 222 lb 3.2 oz (100.8 kg)  09/11/23 225 lb 3.2 oz (102.2 kg)  02/18/23 226 lb (102.5 kg)       Assessment & Plan:   Problem List Items Addressed This Visit     Annual visit for general adult medical examination without abnormal findings - Primary   Patient encouraged to maintain heart healthy diet, regular exercise, adequate sleep. Consider daily probiotics. Take medications as prescribed Labs ordered and reviewed.        Anxiety   Stable on 20 mg Lexapro  daily. Denies SI/HI        Essential hypertension   Pt has not been taking medications. Rx refilled for Losartan  25 mg.  Encouraged monitoring BP at home with goal >140/90. Bring log to FU appointment. Encourage heart  healthy diet such as the DASH diet and exercise as tolerated.  Plan three-month follow-up unless blood pressure uncontrolled, RTC sooner      Relevant Medications   ezetimibe  (ZETIA ) 10 MG tablet   losartan  (COZAAR ) 25 MG tablet   Hyperlipidemia   Encourage heart healthy diet such as MIND or DASH diet, increase exercise, avoid trans fats, simple carbohydrates and processed foods, consider a krill or fish or flaxseed oil cap daily.   Rx-zetia  10 mg daily . Medication and common side effects reviewed with the patient; patient voiced understanding and had no further questions at this time.       Relevant Medications   ezetimibe  (ZETIA ) 10 MG tablet   losartan  (COZAAR ) 25 MG tablet   Medication management   Type 2 diabetes mellitus with obesity (HCC)   Type 2 diabetes with recent A1c of 5.7%. Discussed importance of glucose control and monitoring. - Recheck A1c today. - Check UACR - Encourage regular follow-up for diabetes management. -Foot exam performed today-WNL      Relevant Medications   losartan  (COZAAR ) 25 MG tablet   Other Relevant Orders   Hemoglobin A1c   Urine Microalbumin w/creat. ratio   Obesity  Discussed dietary habits and exercise.  - Continue Ozempic  1 mg weekly. - Encourage small, frequent meals with protein focus. - Consider referral to [facility] pt denies at this time. - Encourage regular physical activity,  Depression Depression on Lexapro . Recent anxiety attack noted.  HCM Immunizations- Pt receive Shingles #1 and Influenza today; Covid-19 and PNA to get at pharmacy    I have discontinued Alena C. Hollibaugh's aspirin  EC. I am also having her start on ezetimibe . Additionally, I am having her maintain her cetirizine, Probiotic Product (PROBIOTIC DAILY PO), Multiple Vitamin (MULTIVITAMIN ADULT PO), Vitamin D3, Ozempic  (0.25 or 0.5 MG/DOSE), escitalopram , BIOTIN PO, and losartan .  Meds ordered this encounter  Medications    ezetimibe  (ZETIA ) 10 MG tablet    Sig: Take 1 tablet (10 mg total) by mouth daily.    Dispense:  90 tablet    Refill:  1    Supervising Provider:   DOMENICA BLACKBIRD A [[id number]]   losartan  (COZAAR ) 25 MG tablet    Sig: Take 1 tablet (25 mg total) by mouth daily.    Dispense:  90 tablet    Refill:  1    Supervising Provider:   DOMENICA BLACKBIRD A [[id number]]

## 2023-12-12 ENCOUNTER — Ambulatory Visit: Payer: Self-pay | Admitting: Student

## 2024-02-22 ENCOUNTER — Other Ambulatory Visit: Payer: Self-pay | Admitting: Student

## 2024-02-22 DIAGNOSIS — F419 Anxiety disorder, unspecified: Secondary | ICD-10-CM

## 2024-03-06 ENCOUNTER — Ambulatory Visit: Admitting: Student

## 2024-03-27 NOTE — Progress Notes (Deleted)
 "  Subjective:     Patient ID: Kaitlin Garcia, female    DOB: 1961/01/10, 63 y.o.   MRN: [id number]  No chief complaint on file.   Discussed the use of AI scribe software for clinical note transcription with the patient, who gave verbal consent to proceed.   HPI  START REPATHA?? ASCVD RISK--CORONARY SCORE--SELF PAY?  positive cologuard- 2022-- DID SHE fu WITH gi?I have asked her if she has a GI preference   DM Type 2 Ozempic  0.5 mg inj weekly  Wt Readings from Last 3 Encounters:  12/11/23 222 lb 3.2 oz (100.8 kg)  09/11/23 225 lb 3.2 oz (102.2 kg)  02/18/23 226 lb (102.5 kg)   Hypertension- Losartan  (Cozaar ) 25 mg daily BP at home:  Depression/Anxiety Lexapro  20 mg daily Compliant Mood stable, Denies SI/HI   Hyperlipidemia No medications-states she was not able to tolerate statins.  Previously on Repatha.  Vitamin D  deficiency 1000 units Vitamin D3 daily   HLD The 10-year ASCVD risk score (Arnett DK, et al., 2019) is: 8.5%   Values used to calculate the score:     Age: 74 years     Clincally relevant sex: Female     Is Non-Hispanic African American: No     Diabetic: Yes     Tobacco smoker: No     Systolic Blood Pressure: 130 mmHg     Is BP treated: No     HDL Cholesterol: 56 mg/dL     Total Cholesterol: 260 mg/dL   Patient denies fever, chills, SOB, CP, palpitations, dyspnea, edema, HA, vision changes, N/V/D, abdominal pain, urinary symptoms, rash, weight changes, and recent illness or hospitalizations.   History of Present Illness              Health Maintenance Due  Topic Date Due   OPHTHALMOLOGY EXAM  Never done   Pneumococcal Vaccine: 50+ Years (2 of 2 - PCV) 06/10/2014   Zoster Vaccines- Shingrix  (2 of 2) 02/05/2024   Mammogram  04/25/2024    Past Medical History:  Diagnosis Date   Allergy    Anxiety    Diabetes mellitus without complication (HCC) 12/2015   History of nephrolithiasis    Hyperlipidemia    Hypertension     Past  Surgical History:  Procedure Laterality Date   CESAREAN SECTION  1995   TONSILLECTOMY AND ADENOIDECTOMY      Family History  Problem Relation Age of Onset   Hypertension Mother    Cancer Father        bladder   Hypertension Father    Diabetes Father    Dementia Maternal Grandmother    Heart attack Maternal Grandfather    Heart attack Paternal Grandfather    Breast cancer Maternal Aunt     Social History   Socioeconomic History   Marital status: Married    Spouse name: Not on file   Number of children: Not on file   Years of education: Not on file   Highest education level: Associate degree: occupational, scientist, product/process development, or vocational program  Occupational History   Not on file  Tobacco Use   Smoking status: Former    Current packs/day: 0.00    Average packs/day: 0.5 packs/day for 8.0 years (4.0 ttl pk-yrs)    Types: Cigarettes    Quit date: 11/01/2003    Years since quitting: 20.4   Smokeless tobacco: Never  Substance and Sexual Activity   Alcohol use: No   Drug use: No   Sexual activity:  Yes    Birth control/protection: Post-menopausal    Comment: married  Other Topics Concern   Not on file  Social History Narrative   Not on file   Social Drivers of Health   Tobacco Use: Medium Risk (12/11/2023)   Patient History    Smoking Tobacco Use: Former    Smokeless Tobacco Use: Never    Passive Exposure: Not on file  Financial Resource Strain: Low Risk (12/10/2023)   Overall Financial Resource Strain (CARDIA)    Difficulty of Paying Living Expenses: Not hard at all  Food Insecurity: No Food Insecurity (12/10/2023)   Epic    Worried About Programme Researcher, Broadcasting/film/video in the Last Year: Never true    Ran Out of Food in the Last Year: Never true  Transportation Needs: No Transportation Needs (12/10/2023)   Epic    Lack of Transportation (Medical): No    Lack of Transportation (Non-Medical): No  Physical Activity: Insufficiently Active (12/10/2023)   Exercise Vital Sign    Days of  Exercise per Week: 3 days    Minutes of Exercise per Session: 20 min  Stress: Stress Concern Present (12/10/2023)   [employer] of Occupational Health - Occupational Stress Questionnaire    Feeling of Stress: To some extent  Social Connections: Moderately Isolated (12/10/2023)   Social Connection and Isolation Panel    Frequency of Communication with Friends and Family: More than three times a week    Frequency of Social Gatherings with Friends and Family: Twice a week    Attends Religious Services: Never    Database Administrator or Organizations: No    Attends Engineer, Structural: Not on file    Marital Status: Married  Catering Manager Violence: Not on file  Depression (PHQ2-9): Low Risk (12/11/2023)   Depression (PHQ2-9)    PHQ-2 Score: 4  Alcohol Screen: Not on file  Housing: Low Risk (12/10/2023)   Epic    Unable to Pay for Housing in the Last Year: No    Number of Times Moved in the Last Year: 0    Homeless in the Last Year: No  Utilities: Not on file  Health Literacy: Not on file    Outpatient Medications Prior to Visit  Medication Sig Dispense Refill   BIOTIN PO Take 1 tablet by mouth daily.     cetirizine (ZYRTEC) 10 MG tablet Take 10 mg by mouth daily.     Cholecalciferol (VITAMIN D3) 250 MCG (10000 UT) TABS Take 1 tablet by mouth daily.     escitalopram  (LEXAPRO ) 20 MG tablet Take 1 tablet (20 mg total) by mouth daily. 90 tablet 0   ezetimibe  (ZETIA ) 10 MG tablet Take 1 tablet (10 mg total) by mouth daily. 90 tablet 1   losartan  (COZAAR ) 25 MG tablet Take 1 tablet (25 mg total) by mouth daily. 90 tablet 1   Multiple Vitamin (MULTIVITAMIN ADULT PO) Take by mouth daily. With extra D3     Probiotic Product (PROBIOTIC DAILY PO) Take by mouth.     Semaglutide ,0.25 or 0.5MG /DOS, (OZEMPIC , 0.25 OR 0.5 MG/DOSE,) 2 MG/3ML SOPN Inject 0.25 mg into the skin once a week. 3 mL 1   No facility-administered medications prior to visit.    Allergies  Allergen Reactions    Codeine Nausea Only   Penicillins     ROS     Objective:    Physical Exam Constitutional:      General: She is not in acute distress.    Appearance:  She is obese. She is not ill-appearing, toxic-appearing or diaphoretic.  HENT:     Head: Normocephalic and atraumatic.     Right Ear: Tympanic membrane, ear canal and external ear normal.     Left Ear: Tympanic membrane, ear canal and external ear normal.     Nose: Nose normal. No congestion.     Mouth/Throat:     Mouth: Mucous membranes are moist.     Pharynx: Oropharynx is clear.  Eyes:     Extraocular Movements: Extraocular movements intact.     Right eye: Normal extraocular motion.     Left eye: Normal extraocular motion.     Conjunctiva/sclera: Conjunctivae normal.     Pupils: Pupils are equal, round, and reactive to light.  Neck:     Thyroid: No thyroid mass or thyromegaly.     Vascular: No carotid bruit or JVD.  Cardiovascular:     Rate and Rhythm: Normal rate and regular rhythm.     Pulses: Normal pulses.          Radial pulses are 2+ on the right side and 2+ on the left side.       Dorsalis pedis pulses are 2+ on the right side and 2+ on the left side.     Heart sounds: Normal heart sounds, S1 normal and S2 normal. No murmur heard.    No friction rub. No gallop.  Pulmonary:     Effort: Pulmonary effort is normal. No respiratory distress.     Breath sounds: Normal breath sounds.  Abdominal:     General: Bowel sounds are normal. There is no distension.     Palpations: Abdomen is soft.     Tenderness: There is no abdominal tenderness. There is no guarding.  Musculoskeletal:        General: Normal range of motion.     Cervical back: Full passive range of motion without pain and normal range of motion. No edema or erythema.     Right lower leg: No edema.     Left lower leg: No edema.  Lymphadenopathy:     Cervical: No cervical adenopathy.  Skin:    General: Skin is warm and dry.     Capillary Refill: Capillary  refill takes less than 2 seconds.  Neurological:     General: No focal deficit present.     Mental Status: She is alert and oriented to person, place, and time.     Cranial Nerves: No cranial nerve deficit.     Motor: No weakness.     Coordination: Coordination normal.     Gait: Gait normal.     Deep Tendon Reflexes: Reflexes normal.  Psychiatric:        Mood and Affect: Mood normal.        Behavior: Behavior normal.        Thought Content: Thought content normal.    Foot Exam: Normal. No deformities, lesions, or ulcers noted. Skin warm and intact. Capillary refill <2 seconds. Dorsalis pedis and posterior tibial pulses +2. Sensation intact to monofilament testing in all tested areas.   There were no vitals taken for this visit. Wt Readings from Last 3 Encounters:  12/11/23 222 lb 3.2 oz (100.8 kg)  09/11/23 225 lb 3.2 oz (102.2 kg)  02/18/23 226 lb (102.5 kg)       Assessment & Plan:   Problem List Items Addressed This Visit       Endocrine   Hyperlipidemia associated with type 2 diabetes mellitus (HCC) -  Primary   Obesity  Discussed dietary habits and exercise.  - Continue Ozempic  1 mg weekly. - Encourage small, frequent meals with protein focus. - Consider referral to [facility]., pt denies at this time. - Encourage regular physical activity,  Depression Depression on Lexapro . Recent anxiety attack noted.  HCM Immunizations- Pt receive Shingles #1 and Influenza today; Covid-19 and PNA to get at pharmacy    I am having Ludmila C.  maintain her cetirizine, Probiotic Product (PROBIOTIC DAILY PO), Multiple Vitamin (MULTIVITAMIN ADULT PO), Vitamin D3, Ozempic  (0.25 or 0.5 MG/DOSE), ezetimibe , BIOTIN PO, losartan , and escitalopram .  No orders of the defined types were placed in this encounter.  "

## 2024-03-31 ENCOUNTER — Ambulatory Visit: Admitting: Student

## 2024-03-31 DIAGNOSIS — I10 Essential (primary) hypertension: Secondary | ICD-10-CM

## 2024-03-31 DIAGNOSIS — Z1211 Encounter for screening for malignant neoplasm of colon: Secondary | ICD-10-CM

## 2024-03-31 DIAGNOSIS — E782 Mixed hyperlipidemia: Secondary | ICD-10-CM

## 2024-03-31 DIAGNOSIS — E1169 Type 2 diabetes mellitus with other specified complication: Secondary | ICD-10-CM

## 2024-04-24 ENCOUNTER — Ambulatory Visit: Admitting: Student

## 2024-04-24 VITALS — BP 128/84 | HR 78 | Resp 16 | Ht 65.0 in | Wt 222.0 lb

## 2024-04-24 DIAGNOSIS — Z23 Encounter for immunization: Secondary | ICD-10-CM

## 2024-04-24 DIAGNOSIS — E559 Vitamin D deficiency, unspecified: Secondary | ICD-10-CM

## 2024-04-24 DIAGNOSIS — Z1211 Encounter for screening for malignant neoplasm of colon: Secondary | ICD-10-CM

## 2024-04-24 DIAGNOSIS — I10 Essential (primary) hypertension: Secondary | ICD-10-CM | POA: Diagnosis not present

## 2024-04-24 DIAGNOSIS — E119 Type 2 diabetes mellitus without complications: Secondary | ICD-10-CM | POA: Diagnosis not present

## 2024-04-24 DIAGNOSIS — E785 Hyperlipidemia, unspecified: Secondary | ICD-10-CM | POA: Diagnosis not present

## 2024-04-24 DIAGNOSIS — E669 Obesity, unspecified: Secondary | ICD-10-CM | POA: Diagnosis not present

## 2024-04-24 DIAGNOSIS — E782 Mixed hyperlipidemia: Secondary | ICD-10-CM | POA: Diagnosis not present

## 2024-04-24 DIAGNOSIS — F419 Anxiety disorder, unspecified: Secondary | ICD-10-CM | POA: Diagnosis not present

## 2024-04-24 MED ORDER — EZETIMIBE 10 MG PO TABS: 10.0000 mg | ORAL_TABLET | Freq: Every day | ORAL | 1 refills | Status: AC

## 2024-04-24 MED ORDER — ESCITALOPRAM OXALATE 20 MG PO TABS: 20.0000 mg | ORAL_TABLET | Freq: Every day | ORAL | 1 refills | Status: AC

## 2024-04-24 MED ORDER — LOSARTAN POTASSIUM 25 MG PO TABS: 25.0000 mg | ORAL_TABLET | Freq: Every day | ORAL | 1 refills | Status: AC

## 2024-04-24 NOTE — Assessment & Plan Note (Addendum)
 [id number] acceptable, minimize simple carbs. Increase exercise as tolerated.

## 2024-04-24 NOTE — Addendum Note (Signed)
 Addended by: VALLI TILLMAN BROCKS on: 04/24/2024 04:33 PM   Modules accepted: Orders

## 2024-04-24 NOTE — Progress Notes (Signed)
 "  Subjective:     Patient ID: Kaitlin Garcia, female    DOB: Aug 24, 1960, 64 y.o.   MRN: [id number]  Chief Complaint  Patient presents with   Follow-up    Patient is here for a follow up.    Discussed the use of AI scribe software for clinical note transcription with the patient, who gave verbal consent to proceed.   HPI  History of Present Illness Kaitlin Garcia is a 64 year old female with hyperlipidemia and type 2 diabetes who presents for medication management and follow-up.  She takes Zetia  daily for hyperlipidemia. She previously stopped statins due to muscle and body aches and stopped Repatha due to intolerance. She is worried about future medication costs with upcoming retirement and loss of current insurance.  She had a positive Cologuard test in January 2023 and did not complete a colonoscopy due to insurance barriers.  She has not had a diabetic eye exam yet and plans to schedule one.  She previously received the first shingles vaccine and a flu shot but has not received a pneumonia vaccine.   Hypertension Losartan  25 mg daily  Depression/Anxiety Lexapro  20 mg daily   Hyperlipidemia Zetia  10 mg  Vitamin D  deficiency 1000 units Vitamin D3 daily   Compliant with medications. Denies adverse Ses.   Patient denies fever, chills, SOB, CP, palpitations, dyspnea, edema, HA, vision changes, N/V/D, abdominal pain, urinary symptoms, rash, weight changes, and recent illness or hospitalizations.   History of Present Illness              Health Maintenance Due  Topic Date Due   OPHTHALMOLOGY EXAM  Never done   Pneumococcal Vaccine: 50+ Years (2 of 2 - PCV) 06/10/2014   Zoster Vaccines- Shingrix  (2 of 2) 02/05/2024   Fecal DNA (Cologuard)  04/10/2024   Mammogram  04/25/2024    Past Medical History:  Diagnosis Date   Allergy    Anxiety    Diabetes mellitus without complication (HCC) 12/2015   History of nephrolithiasis    Hyperlipidemia     Hypertension     Past Surgical History:  Procedure Laterality Date   CESAREAN SECTION  1995   TONSILLECTOMY AND ADENOIDECTOMY      Family History  Problem Relation Age of Onset   Hypertension Mother    Cancer Father        bladder   Hypertension Father    Diabetes Father    Dementia Maternal Grandmother    Heart attack Maternal Grandfather    Heart attack Paternal Grandfather    Breast cancer Maternal Aunt     Social History   Socioeconomic History   Marital status: Married    Spouse name: Not on file   Number of children: Not on file   Years of education: Not on file   Highest education level: Some college, no degree  Occupational History   Not on file  Tobacco Use   Smoking status: Former    Current packs/day: 0.00    Average packs/day: 0.5 packs/day for 8.0 years (4.0 ttl pk-yrs)    Types: Cigarettes    Quit date: 11/01/2003    Years since quitting: 20.4   Smokeless tobacco: Never  Substance and Sexual Activity   Alcohol use: No   Drug use: No   Sexual activity: Yes    Birth control/protection: Post-menopausal    Comment: married  Other Topics Concern   Not on file  Social History Narrative   Not on file  Social Drivers of Health   Tobacco Use: Medium Risk (04/24/2024)   Patient History    Smoking Tobacco Use: Former    Smokeless Tobacco Use: Never    Passive Exposure: Not on file  Financial Resource Strain: Low Risk (04/24/2024)   Overall Financial Resource Strain (CARDIA)    Difficulty of Paying Living Expenses: Not hard at all  Food Insecurity: No Food Insecurity (04/24/2024)   Epic    Worried About Programme Researcher, Broadcasting/film/video in the Last Year: Never true    Ran Out of Food in the Last Year: Never true  Transportation Needs: No Transportation Needs (04/24/2024)   Epic    Lack of Transportation (Medical): No    Lack of Transportation (Non-Medical): No  Physical Activity: Insufficiently Active (04/24/2024)   Exercise Vital Sign    Days of Exercise per  Week: 4 days    Minutes of Exercise per Session: 20 min  Stress: Stress Concern Present (04/24/2024)   [employer] of Occupational Health - Occupational Stress Questionnaire    Feeling of Stress: Rather much  Social Connections: Socially Integrated (04/24/2024)   Social Connection and Isolation Panel    Frequency of Communication with Friends and Family: More than three times a week    Frequency of Social Gatherings with Friends and Family: Twice a week    Attends Religious Services: More than 4 times per year    Active Member of [employer] or Organizations: Yes    Attends Banker Meetings: More than 4 times per year    Marital Status: Married  Catering Manager Violence: Not on file  Depression (PHQ2-9): Low Risk (04/24/2024)   Depression (PHQ2-9)    PHQ-2 Score: 2  Alcohol Screen: Not on file  Housing: Low Risk (04/24/2024)   Epic    Unable to Pay for Housing in the Last Year: No    Number of Times Moved in the Last Year: 0    Homeless in the Last Year: No  Utilities: Not on file  Health Literacy: Not on file    Outpatient Medications Prior to Visit  Medication Sig Dispense Refill   BIOTIN PO Take 1 tablet by mouth daily.     cetirizine (ZYRTEC) 10 MG tablet Take 10 mg by mouth daily.     Cholecalciferol (VITAMIN D3) 250 MCG (10000 UT) TABS Take 1 tablet by mouth daily.     Magnesium 400 MG CAPS Take 1 tablet by mouth once.     Multiple Vitamin (MULTIVITAMIN ADULT PO) Take by mouth daily. With extra D3     Probiotic Product (PROBIOTIC DAILY PO) Take by mouth.     escitalopram  (LEXAPRO ) 20 MG tablet Take 1 tablet (20 mg total) by mouth daily. 90 tablet 0   ezetimibe  (ZETIA ) 10 MG tablet Take 1 tablet (10 mg total) by mouth daily. 90 tablet 1   losartan  (COZAAR ) 25 MG tablet Take 1 tablet (25 mg total) by mouth daily. 90 tablet 1   Semaglutide ,0.25 or 0.5MG /DOS, (OZEMPIC , 0.25 OR 0.5 MG/DOSE,) 2 MG/3ML SOPN Inject 0.25 mg into the skin once a week. 3 mL 1   No  facility-administered medications prior to visit.    Allergies  Allergen Reactions   Statins     Myalgia   Codeine Nausea Only   Penicillins     ROS See HPI    Objective:     General: No acute distress. Awake and conversant.  Eyes: Normal conjunctiva, anicteric. Round symmetric pupils.  Respiratory: CTAB. Respirations are  non-labored. No wheezing.  Skin: Warm. No rashes or ulcers.  Psych: Alert and oriented. Cooperative, Appropriate mood and affect, Normal judgment.  CV: RRR. No murmur. No lower extremity edema.  MSK: Normal ambulation. No clubbing or cyanosis.  Neuro:  CN II-XII grossly normal.     BP 128/84 (BP Location: Left Arm, Patient Position: Sitting, Cuff Size: Large)   Pulse 78   Resp 16   Ht 5' 5 (1.651 m)   Wt 222 lb (100.7 kg)   SpO2 98%   BMI 36.94 kg/m  Wt Readings from Last 3 Encounters:  04/24/24 222 lb (100.7 kg)  12/11/23 222 lb 3.2 oz (100.8 kg)  09/11/23 225 lb 3.2 oz (102.2 kg)       Assessment & Plan:   Problem List Items Addressed This Visit       Cardiovascular and Mediastinum   Essential hypertension   Well controlled, no changes to meds. Encouraged heart healthy diet such as the DASH diet and exercise as tolerated.        Relevant Medications   losartan  (COZAAR ) 25 MG tablet   ezetimibe  (ZETIA ) 10 MG tablet     Endocrine   Type 2 diabetes mellitus in patient with obesity (HCC) - Primary   hgba1c acceptable, minimize simple carbs. Increase exercise as tolerated.       Relevant Medications   losartan  (COZAAR ) 25 MG tablet   Other Relevant Orders   HgB A1c     Other   Anxiety   Stable on 20 mg Lexapro  daily. Denies SI/HI        Relevant Medications   escitalopram  (LEXAPRO ) 20 MG tablet   Hyperlipidemia   Hx statin intolerance.  Stable on Zetia  10 mg daily. Encourage heart healthy diet such as MIND or DASH diet, increase exercise, avoid trans fats, simple carbohydrates and processed foods, consider a krill or fish or  flaxseed oil cap daily.        Relevant Medications   losartan  (COZAAR ) 25 MG tablet   ezetimibe  (ZETIA ) 10 MG tablet   Other Relevant Orders   Lipid panel   Vitamin D  deficiency   Supplement and Monitor.      Other Visit Diagnoses       Colon cancer screening       Relevant Orders   Cologuard      HCM CRC: Cologuard ordered  FU 6 months CPE   I have discontinued Jasmen C. Pfeifle's Ozempic  (0.25 or 0.5 MG/DOSE). I am also having her maintain her cetirizine, Probiotic Product (PROBIOTIC DAILY PO), Multiple Vitamin (MULTIVITAMIN ADULT PO), Vitamin D3, BIOTIN PO, Magnesium, losartan , escitalopram , and ezetimibe .  Meds ordered this encounter  Medications   losartan  (COZAAR ) 25 MG tablet    Sig: Take 1 tablet (25 mg total) by mouth daily.    Dispense:  90 tablet    Refill:  1   escitalopram  (LEXAPRO ) 20 MG tablet    Sig: Take 1 tablet (20 mg total) by mouth daily.    Dispense:  90 tablet    Refill:  1   ezetimibe  (ZETIA ) 10 MG tablet    Sig: Take 1 tablet (10 mg total) by mouth daily.    Dispense:  90 tablet    Refill:  1   "

## 2024-04-24 NOTE — Assessment & Plan Note (Signed)
 Hx statin intolerance.  Stable on Zetia  10 mg daily. Encourage heart healthy diet such as MIND or DASH diet, increase exercise, avoid trans fats, simple carbohydrates and processed foods, consider a krill or fish or flaxseed oil cap daily.

## 2024-04-24 NOTE — Assessment & Plan Note (Signed)
 Well controlled, no changes to meds. Encouraged heart healthy diet such as the DASH diet and exercise as tolerated.

## 2024-04-24 NOTE — Assessment & Plan Note (Signed)
 Stable on 20 mg Lexapro  daily. Denies SI/HI

## 2024-04-24 NOTE — Assessment & Plan Note (Signed)
 Supplement and Monitor

## 2024-04-25 LAB — HEMOGLOBIN A1C
Hgb A1c MFr Bld: 5.7 % — ABNORMAL HIGH
Mean Plasma Glucose: 117 mg/dL
eAG (mmol/L): 6.5 mmol/L

## 2024-04-25 LAB — LIPID PANEL
Cholesterol: 193 mg/dL
HDL: 49 mg/dL — ABNORMAL LOW
LDL Cholesterol (Calc): 112 mg/dL — ABNORMAL HIGH
Non-HDL Cholesterol (Calc): 144 mg/dL — ABNORMAL HIGH
Total CHOL/HDL Ratio: 3.9 (calc)
Triglycerides: 200 mg/dL — ABNORMAL HIGH

## 2024-04-27 ENCOUNTER — Ambulatory Visit: Payer: Self-pay | Admitting: Student

## 2024-04-27 NOTE — Addendum Note (Signed)
 Addended by: WHEELER HARLENE CROME on: 04/27/2024 12:29 PM   Modules accepted: Level of Service

## 2024-04-30 LAB — HM MAMMOGRAPHY
